# Patient Record
Sex: Female | Born: 1985 | Race: Asian | Hispanic: No | Marital: Married | Smoking: Never smoker
Health system: Southern US, Community
[De-identification: ages and names within clinical notes are randomized; demographics above are authoritative.]

## PROBLEM LIST (undated history)

## (undated) DIAGNOSIS — R55 Syncope and collapse: Secondary | ICD-10-CM

## (undated) DIAGNOSIS — IMO0002 Reserved for concepts with insufficient information to code with codable children: Secondary | ICD-10-CM

## (undated) DIAGNOSIS — R5383 Other fatigue: Secondary | ICD-10-CM

## (undated) DIAGNOSIS — Z789 Other specified health status: Secondary | ICD-10-CM

## (undated) HISTORY — DX: Reserved for concepts with insufficient information to code with codable children: IMO0002

## (undated) HISTORY — DX: Other fatigue: R53.83

## (undated) HISTORY — DX: Syncope and collapse: R55

## (undated) HISTORY — PX: BREAST SURGERY: SHX581

---

## 2008-05-19 ENCOUNTER — Ambulatory Visit (HOSPITAL_COMMUNITY): Admission: RE | Admit: 2008-05-19 | Discharge: 2008-05-19 | Payer: Self-pay | Admitting: Obstetrics & Gynecology

## 2008-08-31 ENCOUNTER — Inpatient Hospital Stay (HOSPITAL_COMMUNITY): Admission: AD | Admit: 2008-08-31 | Discharge: 2008-09-02 | Payer: Self-pay | Admitting: Obstetrics & Gynecology

## 2008-08-31 ENCOUNTER — Ambulatory Visit: Payer: Self-pay | Admitting: Obstetrics and Gynecology

## 2008-09-08 ENCOUNTER — Inpatient Hospital Stay (HOSPITAL_COMMUNITY): Admission: AD | Admit: 2008-09-08 | Discharge: 2008-09-08 | Payer: Self-pay | Admitting: Obstetrics & Gynecology

## 2008-09-08 ENCOUNTER — Ambulatory Visit: Payer: Self-pay | Admitting: Obstetrics and Gynecology

## 2010-08-02 ENCOUNTER — Emergency Department (HOSPITAL_COMMUNITY)
Admission: EM | Admit: 2010-08-02 | Discharge: 2010-08-02 | Payer: Self-pay | Source: Home / Self Care | Admitting: Family Medicine

## 2010-12-06 LAB — CBC
HCT: 38.1 % (ref 36.0–46.0)
HCT: 42.8 % (ref 36.0–46.0)
Hemoglobin: 12.9 g/dL (ref 12.0–15.0)
Hemoglobin: 14.5 g/dL (ref 12.0–15.0)
MCHC: 34 g/dL (ref 30.0–36.0)
MCHC: 33.9 g/dL (ref 30.0–36.0)
MCV: 91.9 fL (ref 78.0–100.0)
MCV: 92.1 fL (ref 78.0–100.0)
Platelets: 195 K/uL (ref 150–400)
RBC: 4.15 MIL/uL (ref 3.87–5.11)
RDW: 13.8 % (ref 11.5–15.5)
RDW: 13.6 % (ref 11.5–15.5)
WBC: 7.3 K/uL (ref 4.0–10.5)

## 2010-12-06 LAB — URINALYSIS, ROUTINE W REFLEX MICROSCOPIC
Glucose, UA: NEGATIVE mg/dL
Hgb urine dipstick: NEGATIVE
Ketones, ur: 15 mg/dL — AB
Nitrite: NEGATIVE
Protein, ur: NEGATIVE mg/dL
Specific Gravity, Urine: 1.025 (ref 1.005–1.030)
Urobilinogen, UA: 0.2 mg/dL (ref 0.0–1.0)
pH: 5.5 (ref 5.0–8.0)

## 2010-12-06 LAB — RPR: RPR Ser Ql: NONREACTIVE

## 2013-08-22 NOTE — L&D Delivery Note (Signed)
Delivery Note At 12:05 AM a viable female was delivered via Vaginal, Spontaneous Delivery (Presentation: ; Occiput Anterior).  APGAR: 9, 9; weight  .   Placenta status: Intact, Spontaneous.  Cord: 3 vessels with the following complications: None.  Cord pH: not obtained  Anesthesia: Local  Episiotomy: None Lacerations: 1st degree;Perineal Suture Repair: 3.0 vicryl rapide Est. Blood Loss (mL):  300 cc  Mom to postpartum.  Baby to Couplet care / Skin to Skin.  Katelyn Bean, Vance Hochmuth C 07/01/2014, 12:44 AM

## 2014-02-17 ENCOUNTER — Other Ambulatory Visit (HOSPITAL_COMMUNITY): Payer: Self-pay | Admitting: Nurse Practitioner

## 2014-02-17 DIAGNOSIS — Z3689 Encounter for other specified antenatal screening: Secondary | ICD-10-CM

## 2014-02-17 LAB — OB RESULTS CONSOLE RUBELLA ANTIBODY, IGM: Rubella: IMMUNE

## 2014-02-17 LAB — OB RESULTS CONSOLE RPR: RPR: NONREACTIVE

## 2014-02-17 LAB — OB RESULTS CONSOLE HIV ANTIBODY (ROUTINE TESTING): HIV: NONREACTIVE

## 2014-02-17 LAB — OB RESULTS CONSOLE ANTIBODY SCREEN: Antibody Screen: NEGATIVE

## 2014-02-17 LAB — OB RESULTS CONSOLE HEPATITIS B SURFACE ANTIGEN: Hepatitis B Surface Ag: NEGATIVE

## 2014-02-17 LAB — OB RESULTS CONSOLE ABO/RH: RH Type: POSITIVE

## 2014-02-17 LAB — OB RESULTS CONSOLE GC/CHLAMYDIA
CHLAMYDIA, DNA PROBE: NEGATIVE
GC PROBE AMP, GENITAL: NEGATIVE

## 2014-02-20 ENCOUNTER — Ambulatory Visit (HOSPITAL_COMMUNITY)
Admission: RE | Admit: 2014-02-20 | Discharge: 2014-02-20 | Disposition: A | Discharge status: Home / Self Care | Payer: BC Managed Care – PPO | Source: Ambulatory Visit | Attending: Nurse Practitioner | Admitting: Nurse Practitioner

## 2014-02-20 DIAGNOSIS — Z3689 Encounter for other specified antenatal screening: Secondary | ICD-10-CM | POA: Diagnosis present

## 2014-04-01 ENCOUNTER — Other Ambulatory Visit: Payer: Self-pay | Admitting: Family Medicine

## 2014-06-16 LAB — OB RESULTS CONSOLE GBS: STREP GROUP B AG: POSITIVE

## 2014-06-30 ENCOUNTER — Inpatient Hospital Stay (HOSPITAL_COMMUNITY)
Admission: AD | Admit: 2014-06-30 | Discharge: 2014-06-30 | Disposition: A | Discharge status: Home / Self Care | Payer: BC Managed Care – PPO | Source: Ambulatory Visit | Attending: Family Medicine | Admitting: Family Medicine

## 2014-06-30 ENCOUNTER — Encounter (HOSPITAL_COMMUNITY): Payer: Self-pay | Admitting: General Practice

## 2014-06-30 ENCOUNTER — Encounter (HOSPITAL_COMMUNITY): Payer: Self-pay

## 2014-06-30 ENCOUNTER — Inpatient Hospital Stay (HOSPITAL_COMMUNITY)
Admission: AD | Admit: 2014-06-30 | Discharge: 2014-07-02 | DRG: 775 | Disposition: A | Discharge status: Home / Self Care | Payer: BC Managed Care – PPO | Source: Ambulatory Visit | Attending: Family Medicine | Admitting: Family Medicine

## 2014-06-30 DIAGNOSIS — Z3A37 37 weeks gestation of pregnancy: Secondary | ICD-10-CM | POA: Diagnosis present

## 2014-06-30 DIAGNOSIS — O99824 Streptococcus B carrier state complicating childbirth: Secondary | ICD-10-CM | POA: Diagnosis present

## 2014-06-30 DIAGNOSIS — IMO0001 Reserved for inherently not codable concepts without codable children: Secondary | ICD-10-CM

## 2014-06-30 DIAGNOSIS — Z3A38 38 weeks gestation of pregnancy: Secondary | ICD-10-CM | POA: Diagnosis not present

## 2014-06-30 DIAGNOSIS — O471 False labor at or after 37 completed weeks of gestation: Secondary | ICD-10-CM | POA: Insufficient documentation

## 2014-06-30 HISTORY — DX: Other specified health status: Z78.9

## 2014-06-30 LAB — URINALYSIS, ROUTINE W REFLEX MICROSCOPIC
Bilirubin Urine: NEGATIVE
Glucose, UA: NEGATIVE mg/dL
KETONES UR: NEGATIVE mg/dL
NITRITE: NEGATIVE
PH: 7 (ref 5.0–8.0)
Protein, ur: NEGATIVE mg/dL
UROBILINOGEN UA: 0.2 mg/dL (ref 0.0–1.0)

## 2014-06-30 LAB — CBC
HEMATOCRIT: 38.3 % (ref 36.0–46.0)
HEMOGLOBIN: 13.2 g/dL (ref 12.0–15.0)
MCH: 31.5 pg (ref 26.0–34.0)
MCHC: 34.5 g/dL (ref 30.0–36.0)
MCV: 91.4 fL (ref 78.0–100.0)
Platelets: 184 10*3/uL (ref 150–400)
RBC: 4.19 MIL/uL (ref 3.87–5.11)
RDW: 14 % (ref 11.5–15.5)
WBC: 7 10*3/uL (ref 4.0–10.5)

## 2014-06-30 LAB — URINE MICROSCOPIC-ADD ON

## 2014-06-30 LAB — COMPREHENSIVE METABOLIC PANEL
ALK PHOS: 160 U/L — AB (ref 39–117)
ALT: 16 U/L (ref 0–35)
ANION GAP: 15 (ref 5–15)
AST: 18 U/L (ref 0–37)
Albumin: 2.9 g/dL — ABNORMAL LOW (ref 3.5–5.2)
BUN: 9 mg/dL (ref 6–23)
CHLORIDE: 99 meq/L (ref 96–112)
CO2: 21 meq/L (ref 19–32)
Calcium: 9.5 mg/dL (ref 8.4–10.5)
Creatinine, Ser: 0.52 mg/dL (ref 0.50–1.10)
Glucose, Bld: 88 mg/dL (ref 70–99)
Potassium: 4.1 mEq/L (ref 3.7–5.3)
SODIUM: 135 meq/L — AB (ref 137–147)
TOTAL PROTEIN: 6.8 g/dL (ref 6.0–8.3)

## 2014-06-30 MED ORDER — OXYCODONE-ACETAMINOPHEN 5-325 MG PO TABS: 2.0000 | ORAL_TABLET | ORAL | Status: DC | PRN

## 2014-06-30 MED ORDER — AMPICILLIN SODIUM 2 G IJ SOLR
2.0000 g | Freq: Once | INTRAMUSCULAR | Status: AC
  Administered 2014-06-30: 2 g via INTRAVENOUS
  Filled 2014-06-30: qty 2000

## 2014-06-30 MED ORDER — INFLUENZA VAC SPLIT QUAD 0.5 ML IM SUSY
0.5000 mL | PREFILLED_SYRINGE | INTRAMUSCULAR | Status: DC
  Filled 2014-06-30: qty 0.5

## 2014-06-30 MED ORDER — ONDANSETRON HCL 4 MG/2ML IJ SOLN: 4.0000 mg | Freq: Four times a day (QID) | INTRAMUSCULAR | Status: DC | PRN

## 2014-06-30 MED ORDER — FENTANYL CITRATE 0.05 MG/ML IJ SOLN: 50.0000 ug | INTRAMUSCULAR | Status: DC | PRN

## 2014-06-30 MED ORDER — OXYTOCIN 40 UNITS IN LACTATED RINGERS INFUSION - SIMPLE MED
62.5000 mL/h | INTRAVENOUS | Status: DC
  Filled 2014-06-30: qty 1000

## 2014-06-30 MED ORDER — OXYTOCIN BOLUS FROM INFUSION
500.0000 mL | INTRAVENOUS | Status: DC
  Administered 2014-07-01: 500 mL via INTRAVENOUS

## 2014-06-30 MED ORDER — OXYCODONE-ACETAMINOPHEN 5-325 MG PO TABS: 1.0000 | ORAL_TABLET | ORAL | Status: DC | PRN

## 2014-06-30 MED ORDER — ACETAMINOPHEN 325 MG PO TABS: 650.0000 mg | ORAL_TABLET | ORAL | Status: DC | PRN

## 2014-06-30 MED ORDER — LIDOCAINE HCL (PF) 1 % IJ SOLN
30.0000 mL | INTRAMUSCULAR | Status: DC | PRN
  Administered 2014-07-01: 30 mL via SUBCUTANEOUS
  Filled 2014-06-30: qty 30

## 2014-06-30 MED ORDER — CITRIC ACID-SODIUM CITRATE 334-500 MG/5ML PO SOLN: 30.0000 mL | ORAL | Status: DC | PRN

## 2014-06-30 MED ORDER — LACTATED RINGERS IV SOLN
500.0000 mL | INTRAVENOUS | Status: DC | PRN
  Administered 2014-06-30: 500 mL via INTRAVENOUS

## 2014-06-30 MED ORDER — LACTATED RINGERS IV SOLN
INTRAVENOUS | Status: DC
  Administered 2014-06-30: 21:00:00 via INTRAVENOUS

## 2014-06-30 MED ORDER — FLEET ENEMA 7-19 GM/118ML RE ENEM: 1.0000 | ENEMA | RECTAL | Status: DC | PRN

## 2014-06-30 MED ORDER — FENTANYL CITRATE 0.05 MG/ML IJ SOLN
100.0000 ug | INTRAMUSCULAR | Status: DC | PRN
  Administered 2014-06-30: 100 ug via INTRAVENOUS
  Filled 2014-06-30: qty 2

## 2014-06-30 NOTE — MAU Note (Signed)
Patient states she has had vaginal bleeding twice this am with bright red blood, more the first time, less the second time. States she is having mild contractions and reports good fetal movement.

## 2014-06-30 NOTE — Discharge Instructions (Signed)
Third Trimester of Pregnancy The third trimester is from week 29 through week 42, months 7 through 9. This trimester is when your unborn baby (fetus) is growing very fast. At the end of the ninth month, the unborn baby is about 20 inches in length. It weighs about 6-10 pounds.  HOME CARE   Avoid all smoking, herbs, and alcohol. Avoid drugs not approved by your doctor.  Only take medicine as told by your doctor. Some medicines are safe and some are not during pregnancy.  Exercise only as told by your doctor. Stop exercising if you start having cramps.  Eat regular, healthy meals.  Wear a good support bra if your breasts are tender.  Helbert not use hot tubs, steam rooms, or saunas.  Wear your seat belt when driving.  Avoid raw meat, uncooked cheese, and liter boxes and soil used by cats.  Take your prenatal vitamins.  Try taking medicine that helps you poop (stool softener) as needed, and if your doctor approves. Eat more fiber by eating fresh fruit, vegetables, and whole grains. Drink enough fluids to keep your pee (urine) clear or pale yellow.  Take warm water baths (sitz baths) to soothe pain or discomfort caused by hemorrhoids. Use hemorrhoid cream if your doctor approves.  If you have puffy, bulging veins (varicose veins), wear support hose. Raise (elevate) your feet for 15 minutes, 3-4 times a day. Limit salt in your diet.  Avoid heavy lifting, wear low heels, and sit up straight.  Rest with your legs raised if you have leg cramps or low back pain.  Visit your dentist if you have not gone during your pregnancy. Use a soft toothbrush to brush your teeth. Be gentle when you floss.  You can have sex (intercourse) unless your doctor tells you not to.  Hargraves not travel far distances unless you must. Only Sather so with your doctor's approval.  Take prenatal classes.  Practice driving to the hospital.  Pack your hospital bag.  Prepare the baby's room.  Go to your doctor visits. GET  HELP IF:  You are not sure if you are in labor or if your water has broken.  You are dizzy.  You have mild cramps or pressure in your lower belly (abdominal).  You have a nagging pain in your belly area.  You continue to feel sick to your stomach (nauseous), throw up (vomit), or have watery poop (diarrhea).  You have bad smelling fluid coming from your vagina.  You have pain with peeing (urination). GET HELP RIGHT AWAY IF:   You have a fever.  You are leaking fluid from your vagina.  You are spotting or bleeding from your vagina.  You have severe belly cramping or pain.  You lose or gain weight rapidly.  You have trouble catching your breath and have chest pain.  You notice sudden or extreme puffiness (swelling) of your face, hands, ankles, feet, or legs.  You have not felt the baby move in over an hour.  You have severe headaches that Sindelar not go away with medicine.  You have vision changes. Document Released: 11/02/2009 Document Revised: 12/03/2012 Document Reviewed: 10/09/2012 ExitCare Patient Information 2015 [employer]. This information is not intended to replace advice given to you by your health care provider. Make sure you discuss any questions you have with your health care provider.  

## 2014-06-30 NOTE — H&P (Signed)
Katelyn Bean is a 28 y.o. female G2P1001 with IUP at 3142w3d presenting for active labor. Pt states she has been having regular, every 5 minutes contractions, associated with scant staining vaginal bleeding.  Membranes are intact, with active fetal movement.   [facility]  Prenatal History/Complications: GBS positive  Past Medical History: Past Medical History  Diagnosis Date  . Medical history non-contributory     Past Surgical History: Past Surgical History  Procedure Laterality Date  . No past surgeries      Obstetrical History: OB History    Gravida Para Term Preterm AB TAB SAB Ectopic Multiple Living   2 1 1       1       Social History: History   Social History  . Marital Status: Married    Spouse Name: N/A    Number of Children: N/A  . Years of Education: N/A   Social History Main Topics  . Smoking status: Never Smoker   . Smokeless tobacco: Never Used  . Alcohol Use: No  . Drug Use: No  . Sexual Activity: Yes   Other Topics Concern  . None   Social History Narrative    Family History: No family history on file.  Allergies: No Known Allergies  Prescriptions prior to admission  Medication Sig Dispense Refill Last Dose  . Prenatal Vit-Fe Fumarate-FA (PRENATAL MULTIVITAMIN) TABS tablet Take 1 tablet by mouth daily at 12 noon.   06/30/2014 at Unknown time  . Prenatal Vit-Fe Fumarate-FA (PREPLUS) 27-1 MG TABS TAKE ONE TABLET BY MOUTH ONCE DAILY (Patient not taking: Reported on 06/30/2014) 50 tablet 0      Review of Systems   Constitutional: No fevers or chills  Temperature 98.2 F (36.8 C), temperature source Oral, height 4\' 11"  (1.499 m), weight 58.514 kg (129 lb). General appearance: alert, cooperative, appears stated age and in pain with contractions Lungs: clear to auscultation bilaterally Heart: regular rate and rhythm Abdomen: soft, non-tender; bowel sounds normal Pelvic: adequate Extremities: Homans sign is  negative, no sign of DVT DTR's wnl Presentation: cephalic Fetal monitoringBaseline: 135 bpm, Variability: Good {> 6 bpm), Accelerations: Reactive and Decelerations: Absent Uterine activityFrequency: Every 2-4 minutes Dilation: 7 Effacement (%): 90 Station: 0 Exam by:: C.Byers, RN   Prenatal labs: ABO, Rh:   Apos Antibody:   Neg Rubella:   Immune RPR:    Neg HBsAg:   Neg HIV:   Declined GBS:   Positive 1 hr Glucola 101 Genetic screening  Neg Anatomy US wnl   Prenatal Transfer Tool  Maternal Diabetes: No Genetic Screening: Normal Maternal Ultrasounds/Referrals: Normal Fetal Ultrasounds or other Referrals:  None Maternal Substance Abuse:  No Significant Maternal Medications:  None Significant Maternal Lab Results: Lab values include: Group B Strep positive     Results for orders placed or performed during the hospital encounter of 06/30/14 (from the past 24 hour(s))  Urinalysis, Routine w reflex microscopic   Collection Time: 06/30/14  9:33 AM  Result Value Ref Range   Color, Urine YELLOW YELLOW   APPearance CLEAR CLEAR   Specific Gravity, Urine <1.005 (L) 1.005 - 1.030   pH 7.0 5.0 - 8.0   Glucose, UA NEGATIVE NEGATIVE mg/dL   Hgb urine dipstick LARGE (A) NEGATIVE   Bilirubin Urine NEGATIVE NEGATIVE   Ketones, ur NEGATIVE NEGATIVE mg/dL   Protein, ur NEGATIVE NEGATIVE mg/dL   Urobilinogen, UA 0.2 0.0 - 1.0 mg/dL   Nitrite NEGATIVE NEGATIVE   Leukocytes, UA  SMALL (A) NEGATIVE  Urine microscopic-add on   Collection Time: 06/30/14  9:33 AM  Result Value Ref Range   Squamous Epithelial / LPF FEW (A) RARE   WBC, UA 3-6 <3 WBC/hpf   RBC / HPF 3-6 <3 RBC/hpf   Bacteria, UA FEW (A) RARE    Assessment: Katelyn Bean is a 28 y.o. G2P1001 at 3649w3d here with active labor #Labor:SOL, expectant mangement #Pain: Analgesia and anesthesia prn #FWB: Category 1 #ID:  GBS positive, will start ampicillin since advanced labor #MOF: breast #MOC:Nexplanon #Circ:   Planning  Jacquiline Doearker, Konnor Vondrasek 06/30/2014, 8:56 PM

## 2014-06-30 NOTE — Progress Notes (Signed)
Dr. Loreta AveAcosta notified of pt in MAU for labor eval, blood noted when wiping, cervix 1.5/thick, bloody show noted, efm tracing reactive, ctx's irregular, bp's, order to d/c home.

## 2014-06-30 NOTE — MAU Note (Signed)
Pt states her contractions started to get bad around 5pm. She states they are coming around 5 mins apart. She rates the pain 5/10. She does not report any leaking of fluid but does have a little bit of blood from an earlier vaginal exam.

## 2014-06-30 NOTE — MAU Note (Signed)
Pt states had streak of blood noted when wiping this am while in restroom. [facility]. Denies h/a, dizziness, or blurred vision.

## 2014-06-30 NOTE — Progress Notes (Signed)
Patient ID: Katelyn Bean, female   DOB: 05/04/1986, 28 y.o.   MRN: [id number] Labor Progress Note  ASSESSMENT:   Katelyn Bean 28 y.o. G2P1001 at 1156w3d in active labor   PLAN:  1) Labor curve reviewed.       Progress: Active, appropriate             Plan: Expectant; consider AROM  2) Fetal heart tracing reviewed.    Cat I, reassuring  3) GBS Status - POS, s/p ampicillin  4) Elevcated BP - Several BP of 140s/90s, will send pre-eclampsia labs - not yet meeting criteria of gHTN given BP < 4 hours apart  5) Other Problems Active Problems:   Active labor   SUBJECTIVE:  Feels increasing pain with contractions   OBJECTIVE:  Vital Signs: Patient Vitals for the past 2 hrs:  BP Pulse  06/30/14 2245 (!) 145/95 mmHg 88   SVE: Dilation: 9, Effacement (%): 90, 100, Station: 0  FHR Monitoring Baseline Rate (A): 140 bpm / moderate variability / + accels / neg decels   Accelerations: 15 x 15 Contraction Frequency (min): 2-5

## 2014-07-01 ENCOUNTER — Encounter (HOSPITAL_COMMUNITY): Payer: Self-pay | Admitting: *Deleted

## 2014-07-01 DIAGNOSIS — Z3A37 37 weeks gestation of pregnancy: Secondary | ICD-10-CM

## 2014-07-01 DIAGNOSIS — O99824 Streptococcus B carrier state complicating childbirth: Secondary | ICD-10-CM

## 2014-07-01 LAB — CBC
HEMATOCRIT: 34.3 % — AB (ref 36.0–46.0)
Hemoglobin: 11.8 g/dL — ABNORMAL LOW (ref 12.0–15.0)
MCH: 31.1 pg (ref 26.0–34.0)
MCHC: 34.4 g/dL (ref 30.0–36.0)
MCV: 90.5 fL (ref 78.0–100.0)
PLATELETS: 171 10*3/uL (ref 150–400)
RBC: 3.79 MIL/uL — ABNORMAL LOW (ref 3.87–5.11)
RDW: 13.9 % (ref 11.5–15.5)
WBC: 10.9 10*3/uL — AB (ref 4.0–10.5)

## 2014-07-01 LAB — TYPE AND SCREEN
ABO/RH(D): A POS
ANTIBODY SCREEN: NEGATIVE

## 2014-07-01 LAB — PROTEIN / CREATININE RATIO, URINE
Creatinine, Urine: 51.71 mg/dL
PROTEIN CREATININE RATIO: 0.47 — AB (ref 0.00–0.15)
Total Protein, Urine: 24.2 mg/dL

## 2014-07-01 LAB — HIV ANTIBODY (ROUTINE TESTING W REFLEX): HIV: NONREACTIVE

## 2014-07-01 LAB — RPR

## 2014-07-01 LAB — ABO/RH: ABO/RH(D): A POS

## 2014-07-01 MED ORDER — WITCH HAZEL-GLYCERIN EX PADS
1.0000 | MEDICATED_PAD | CUTANEOUS | Status: DC | PRN
Start: 2014-07-01 — End: 2014-07-02

## 2014-07-01 MED ORDER — ONDANSETRON HCL 4 MG PO TABS: 4.0000 mg | ORAL_TABLET | ORAL | Status: DC | PRN

## 2014-07-01 MED ORDER — PRENATAL MULTIVITAMIN CH
1.0000 | ORAL_TABLET | Freq: Every day | ORAL | Status: DC
  Administered 2014-07-01 – 2014-07-02 (×2): 1 via ORAL
  Filled 2014-07-01 (×2): qty 1

## 2014-07-01 MED ORDER — LANOLIN HYDROUS EX OINT: TOPICAL_OINTMENT | CUTANEOUS | Status: DC | PRN

## 2014-07-01 MED ORDER — BENZOCAINE-MENTHOL 20-0.5 % EX AERO
1.0000 | INHALATION_SPRAY | CUTANEOUS | Status: DC | PRN
Start: 2014-07-01 — End: 2014-07-02

## 2014-07-01 MED ORDER — OXYCODONE-ACETAMINOPHEN 5-325 MG PO TABS: 1.0000 | ORAL_TABLET | ORAL | Status: DC | PRN

## 2014-07-01 MED ORDER — SENNOSIDES-DOCUSATE SODIUM 8.6-50 MG PO TABS
2.0000 | ORAL_TABLET | ORAL | Status: DC
  Administered 2014-07-02: 2 via ORAL
  Filled 2014-07-01 (×2): qty 2

## 2014-07-01 MED ORDER — DIPHENHYDRAMINE HCL 25 MG PO CAPS: 25.0000 mg | ORAL_CAPSULE | Freq: Four times a day (QID) | ORAL | Status: DC | PRN

## 2014-07-01 MED ORDER — OXYCODONE-ACETAMINOPHEN 5-325 MG PO TABS
2.0000 | ORAL_TABLET | ORAL | Status: DC | PRN
Start: 2014-07-01 — End: 2014-07-02

## 2014-07-01 MED ORDER — DIBUCAINE 1 % RE OINT: 1.0000 "application " | TOPICAL_OINTMENT | RECTAL | Status: DC | PRN

## 2014-07-01 MED ORDER — ZOLPIDEM TARTRATE 5 MG PO TABS: 5.0000 mg | ORAL_TABLET | Freq: Every evening | ORAL | Status: DC | PRN

## 2014-07-01 MED ORDER — IBUPROFEN 600 MG PO TABS
600.0000 mg | ORAL_TABLET | Freq: Four times a day (QID) | ORAL | Status: DC
  Administered 2014-07-01 – 2014-07-02 (×5): 600 mg via ORAL
  Filled 2014-07-01 (×8): qty 1

## 2014-07-01 MED ORDER — ONDANSETRON HCL 4 MG/2ML IJ SOLN: 4.0000 mg | INTRAMUSCULAR | Status: DC | PRN

## 2014-07-01 MED ORDER — SIMETHICONE 80 MG PO CHEW: 80.0000 mg | CHEWABLE_TABLET | ORAL | Status: DC | PRN

## 2014-07-01 MED ORDER — TETANUS-DIPHTH-ACELL PERTUSSIS 5-2.5-18.5 LF-MCG/0.5 IM SUSP: 0.5000 mL | Freq: Once | INTRAMUSCULAR | Status: DC

## 2014-07-01 MED ORDER — INFLUENZA VAC SPLIT QUAD 0.5 ML IM SUSY
0.5000 mL | PREFILLED_SYRINGE | INTRAMUSCULAR | Status: AC
  Administered 2014-07-02: 0.5 mL via INTRAMUSCULAR

## 2014-07-01 NOTE — Lactation Note (Signed)
This note was copied from the chart of [name]. Lactation Consultation Note; Mom reports that baby nursed for 10 min about 1 1/2 hours ago. Now has been trying but baby is too sleepy. Mom is easily able to hand express Colostrum. Reports her first baby never latched and got bottles then wouldn't latch. Tried for about 1 month. Mom reports this baby is nursing much better than her first Reviewed feeding cues and encouraged to feed whenever she sees them.BF brochure given with resources for support after DC. No questions at present. Encouraged to page when baby wakes for next feeding.  Patient Name: [name] VWUJW'JToday's Date: 07/01/2014 Reason for consult: Initial assessment;Infant < 6lbs;Late preterm infant   Maternal Data Formula Feeding for Exclusion: No Does the patient have breastfeeding experience prior to this delivery?: Yes  Feeding Feeding Type: Breast Fed Length of feed: 10 min  LATCH Score/Interventions Latch: Too sleepy or reluctant, no latch achieved, no sucking elicited.  Audible Swallowing: None  Type of Nipple: Everted at rest and after stimulation  Comfort (Breast/Nipple): Soft / non-tender     Hold (Positioning): No assistance needed to correctly position infant at breast.  LATCH Score: 6  Lactation Tools Discussed/Used     Consult Status Consult Status: Follow-up Date: 07/02/14 Follow-up type: In-patient    Pamelia HoitWeeks, Francisca Langenderfer D 07/01/2014, 11:13 AM

## 2014-07-01 NOTE — Plan of Care (Signed)
Problem: Phase II Progression Outcomes Goal: Progress activity as tolerated unless otherwise ordered Outcome: Completed/Met Date Met:  07/01/14 Goal: Afebrile, VS remain stable Outcome: Completed/Met Date Met:  07/01/14 Goal: Rh isoimmunization per orders Outcome: Not Applicable Date Met:  05/29/11 Goal: Tolerating diet Outcome: Completed/Met Date Met:  07/01/14 Goal: Other Phase II Outcomes/Goals Outcome: Completed/Met Date Met:  07/01/14

## 2014-07-01 NOTE — Plan of Care (Signed)
Problem: Consults Goal: Postpartum Patient Education (See Patient Education module for education specifics.) Outcome: Completed/Met Date Met:  07/01/14  Problem: Phase I Progression Outcomes Goal: Pain controlled with appropriate interventions Outcome: Completed/Met Date Met:  07/01/14 Goal: Voiding adequately Outcome: Completed/Met Date Met:  07/01/14 Goal: OOB as tolerated unless otherwise ordered Outcome: Completed/Met Date Met:  07/01/14 Goal: VS, stable, temp < 100.4 degrees F Outcome: Completed/Met Date Met:  07/01/14 Goal: Initial discharge plan identified Outcome: Completed/Met Date Met:  07/01/14 Goal: Other Phase I Outcomes/Goals Outcome: Completed/Met Date Met:  07/01/14  Problem: Phase II Progression Outcomes Goal: Pain controlled on oral analgesia Outcome: Completed/Met Date Met:  07/01/14

## 2014-07-02 MED ORDER — IBUPROFEN 600 MG PO TABS: 600.0000 mg | ORAL_TABLET | Freq: Four times a day (QID) | ORAL | Status: DC

## 2014-07-02 NOTE — Discharge Summary (Signed)
Obstetric Discharge Summary Reason for Admission: onset of labor Prenatal Procedures: none Intrapartum Procedures: spontaneous vaginal delivery Postpartum Procedures: none Complications-Operative and Postpartum: first degree perineal laceration  Hospital Course:  Patient was admitted in active labor and progressed quickly without complication.   Delivery Note At 12:05 AM a viable female was delivered via Vaginal, Spontaneous Delivery (Presentation: ; Occiput Anterior). APGAR: 9, 9; weight .  Placenta status: Intact, Spontaneous. Cord: 3 vessels with the following complications: None. Cord pH: not obtained  Anesthesia: Local  Episiotomy: None Lacerations: 1st degree;Perineal Suture Repair: 3.0 vicryl rapide Est. Blood Loss (mL): 300 cc  Mom to postpartum. Baby to Couplet care / Skin to Skin.  Elita BooneRoberts, Caroline C 07/01/2014, 12:44 AM  Postpartum, the patient did well with no complications.  On the day of discharge,  Pt denied problems with ambulating, voiding or po intake.  She denied nausea or vomiting.  Pain was well controlled.  She has had flatus. She has had bowel movement.  Lochia Small.  Plan for birth control is nexplanon.    H/H: Lab Results  Component Value Date/Time   HGB 11.8* 07/01/2014 06:07 AM   HCT 34.3* 07/01/2014 06:07 AM    Filed Vitals:   07/02/14 0552  BP: 106/66  Pulse: 73  Temp: 97.9 F (36.6 C)  Resp:     Physical Exam: VSS NAD Abd: Appropriately tender, ND, Fundus firm, below umbilicus No c/c/e, Neg homan's sign, neg cords Lochia Appropriate  Discharge Diagnoses: Term Pregnancy-delivered  Discharge Information: Date: 07/02/2014 Activity: pelvic rest Diet: routine  Medications: PNV and Ibuprofen Breast feeding:  Yes Condition: stable Instructions: refer to handout Discharge to: home      Medication List    TAKE these medications        ibuprofen 600 MG tablet  Commonly known as:  ADVIL,MOTRIN  Take 1 tablet (600 mg  total) by mouth every 6 (six) hours.     prenatal multivitamin Tabs tablet  Take 1 tablet by mouth daily at 12 noon.       Follow-up Information    [facility] [place] Schedule an appointment as soon as possible for a visit in 6 weeks.   Why:  for postpartum follow up   Contact information:   [address] [facility] [place] [id number] [phone]      Jacquiline DoeParker, Ashanti Ratti 07/02/2014,7:32 AM

## 2014-07-02 NOTE — Lactation Note (Signed)
This note was copied from the chart of Katelyn Betsy PriesYen Hoaglin. Lactation Consultation Note: Infant is 2133 hours old and is at 5 % weight loss. Weight 5.1.  Infant 37.4 weeks. Mother states that infant falls asleep while breastfeeding. Advised mother on behaviors of Near term infants.  Advised mother of need to rouse infant with STS. Assist mother with hand expression. Infant fed 5 ml of EBM with a spoon. Mother has large amts of colostrum. Infant latced on (R) breast with good depth. Observed audible swallows. Mother taught breast compression. Infant sustained latch for 15 mins. Assist mother with post pumping. Mother pumped 5 mls of ebm. Mother was advised to breastfeed, supplement with EBM and post pump. Mother has good understanding of breastfeeding plan. Mother was given a manual pump with instructions. [facility] for follow up. [facility]. Recommend that mother continue to feed infant at least 8-12 times in 24 hours of life.  Patient Name: Katelyn Bean WUJWJ'XToday's Date: 07/02/2014 Reason for consult: Follow-up assessment   Maternal Data    Feeding Feeding Type: Breast Milk Length of feed: 15 min  LATCH Score/Interventions Latch: Grasps breast easily, tongue down, lips flanged, rhythmical sucking. Intervention(s): Skin to skin;Waking techniques Intervention(s): Adjust position;Assist with latch;Breast compression  Audible Swallowing: Spontaneous and intermittent Intervention(s): Hand expression Intervention(s): Skin to skin;Hand expression  Type of Nipple: Everted at rest and after stimulation  Comfort (Breast/Nipple): Filling, red/small blisters or bruises, mild/mod discomfort  Problem noted: Filling  Hold (Positioning): Assistance needed to correctly position infant at breast and maintain latch. Intervention(s): Support Pillows;Position options;Skin to skin  LATCH Score: 8  Lactation Tools Discussed/Used     Consult  Status Consult Status: Follow-up Date: 07/03/14 Follow-up type: In-patient    Stevan BornKendrick, Pamela [facility] 07/02/2014, 11:08 AM

## 2014-07-02 NOTE — Discharge Instructions (Signed)

## 2014-07-03 ENCOUNTER — Ambulatory Visit: Payer: Self-pay

## 2014-07-03 NOTE — Lactation Note (Addendum)
This note was copied from the chart of Katelyn Betsy PriesYen Reaney. Lactation Consultation Note; Follow up visit with mom before DC. Mom reports he is nursing better and her breasts are feeling heavier today. Reports that breasts soften after nursing. Reports nipples are a little sore. It hurts the first few minutes of nursing but then eases off. Baby asleep in visitors arms. Comfort gels given with instructions for use. LS 9 by RN. Weight the same as yesterday.No questions at present. To call prn. Reviewed BFS[facility] and OP appointments as resources for support after DC.   Patient Name: Katelyn Bean HYQMV'HToday's Date: 07/03/2014 Reason for consult: Follow-up assessment;Infant < 6lbs   Maternal Data Formula Feeding for Exclusion: No Has patient been taught Hand Expression?: Yes Does the patient have breastfeeding experience prior to this delivery?: Yes  Feeding   LATCH Score/Interventions  Lactation Tools Discussed/Used     Consult Status Consult Status: Complete    Pamelia HoitWeeks, Jafar Poffenberger D 07/03/2014, 10:30 AM

## 2014-07-10 ENCOUNTER — Ambulatory Visit: Payer: BC Managed Care – PPO | Admitting: Family Medicine

## 2014-07-14 ENCOUNTER — Other Ambulatory Visit: Payer: Self-pay | Admitting: Family Medicine

## 2014-08-11 ENCOUNTER — Ambulatory Visit: Payer: BC Managed Care – PPO | Admitting: Family Medicine

## 2014-08-11 ENCOUNTER — Encounter: Payer: Self-pay | Admitting: Family Medicine

## 2014-09-02 ENCOUNTER — Encounter: Payer: Self-pay | Admitting: General Practice

## 2015-05-07 ENCOUNTER — Other Ambulatory Visit: Payer: Self-pay | Admitting: "Women's Health Care

## 2015-05-07 DIAGNOSIS — N6320 Unspecified lump in the left breast, unspecified quadrant: Secondary | ICD-10-CM

## 2015-05-12 ENCOUNTER — Ambulatory Visit
Admission: RE | Admit: 2015-05-12 | Discharge: 2015-05-12 | Disposition: A | Discharge status: Home / Self Care | Payer: BLUE CROSS/BLUE SHIELD | Source: Ambulatory Visit | Attending: "Women's Health Care | Admitting: "Women's Health Care

## 2015-05-12 DIAGNOSIS — N6320 Unspecified lump in the left breast, unspecified quadrant: Secondary | ICD-10-CM

## 2015-08-03 ENCOUNTER — Encounter: Payer: Self-pay | Admitting: *Deleted

## 2015-08-04 ENCOUNTER — Encounter: Payer: Self-pay | Admitting: Internal Medicine

## 2015-08-06 ENCOUNTER — Ambulatory Visit (INDEPENDENT_AMBULATORY_CARE_PROVIDER_SITE_OTHER): Payer: BLUE CROSS/BLUE SHIELD | Admitting: Cardiology

## 2015-08-06 ENCOUNTER — Encounter: Payer: Self-pay | Admitting: Cardiology

## 2015-08-06 VITALS — BP 129/87 | HR 81 | Ht 59.0 in | Wt 103.6 lb

## 2015-08-06 DIAGNOSIS — R55 Syncope and collapse: Secondary | ICD-10-CM | POA: Diagnosis not present

## 2015-08-06 NOTE — Progress Notes (Signed)
Electrophysiology Office Note   Date:  08/06/2015   ID:  Katelyn SilvasYen Thi Gotham, DOB Jul 25, 1986, MRN [id number]  PCP:  Frederich ChickWEBB, CAROL D, MD  Primary Electrophysiologist: Regan LemmingWill Martin Cacie Gaskins, MD    Chief Complaint  Patient presents with  . Advice Only    syncope  . Loss of Consciousness     History of Present Illness: Katelyn Bean is a 29 y.o. female who presents today for electrophysiology evaluation.   She presents today for evaluation of syncope. She says that she was working out at her gym in her stomach being hurt and she passed out. She stated that she has passed out twice since then. She has been having these symptoms for the last 3 years. She has passed out 3 times a minute and that amount of time. She passed out once last week while at the gym and then last September. They are all similar symptoms with abdominal pain and headache that preceded the syncope.  Today, she denies symptoms of palpitations, chest pain, shortness of breath, orthopnea, PND, lower extremity edema, claudication, dizziness, presyncope, syncope, bleeding, or neurologic sequela. The patient is tolerating medications without difficulties and is otherwise without complaint today.    Past Medical History  Diagnosis Date  . Medical history non-contributory   . Syncope     x2  . Fatigue   . Gave birth to child recently     x2   Past Surgical History  Procedure Laterality Date  . No past surgeries       Current Outpatient Prescriptions  Medication Sig Dispense Refill  . ciprofloxacin (CIPRO) 500 MG tablet Take 500 mg by mouth 2 (two) times daily.  0   No current facility-administered medications for this visit.    Allergies:   Review of patient's allergies indicates no known allergies.   Social History:  The patient  reports that she has never smoked. She has never used smokeless tobacco. She reports that she does not drink alcohol or use illicit drugs.   Family History:  The patient's family history includes  Arthritis in her father; Hypertension in her mother.    ROS:  Please see the history of present illness.   Otherwise, review of systems is positive for weight change, chest pain, abdominal pain, dizziness, passing out, headaches, back pain.   All other systems are reviewed and negative.    PHYSICAL EXAM: VS:  BP 129/87 mmHg  Pulse 81  Ht 4\' 11"  (1.499 m)  Wt 103 lb 9.6 oz (46.993 kg)  BMI 20.91 kg/m2 , BMI Body mass index is 20.91 kg/(m^2). GEN: Well nourished, well developed, in no acute distress HEENT: normal Neck: no JVD, carotid bruits, or masses Cardiac: RRR; no murmurs, rubs, or gallops,no edema  Respiratory:  clear to auscultation bilaterally, normal work of breathing GI: soft, nontender, nondistended, + BS MS: no deformity or atrophy Skin: warm and dry Neuro:  Strength and sensation are intact Psych: euthymic mood, full affect  EKG:  EKG is ordered today. The ekg ordered today shows sinus rhythm, rate 78  Recent Labs: No results found for requested labs within last 365 days.    Lipid Panel  No results found for: CHOL, TRIG, HDL, CHOLHDL, VLDL, LDLCALC, LDLDIRECT   Wt Readings from Last 3 Encounters:  08/06/15 103 lb 9.6 oz (46.993 kg)  06/30/14 129 lb (58.514 kg)  06/30/14 129 lb 6.4 oz (58.695 kg)    ASSESSMENT AND PLAN:  1.  Syncope: unknown cause as she  has a normal ECG.  She did have syncope post exercise once and Kirsi Hugh therefore order treadmill exercise test.  If that is negative, Caprisha Bridgett also get a  30 day event monitor to determine if she has any cardiac causes of syncope.  Due to her symptoms it is possible that these are all vagally induced as she does have abdominal pain and headache prior to the episodes of syncope. Due to the fact that she is passing out, she is not to drive for 6 months by [place] law unless a cause of her syncope has been discovered.  If it is determined that the episodes of syncope are caused by vagal episodes, she would be able to drive  depending on further evaluation and education of symptoms. She can follow up with [place] PRN depending on the results of the monitor and stress test.   Current medicines are reviewed at length with the patient today.   The patient does not have concerns regarding her medicines.  The following changes were made today:  none  Labs/ tests ordered today include:  Orders Placed This Encounter  Procedures  . Exercise Tolerance Test  . Cardiac event monitor  . EKG 12-Lead     Disposition:   FU with Infantof Villagomez pending monitor results  Signed, Jerricka Carvey Jorja Loa, MD  08/06/2015 3:06 PM     [facility] [address] [place] [address] [phone] (office) [phone] (fax)

## 2015-08-06 NOTE — Patient Instructions (Signed)
Medication Instructions:  Your physician recommends that you continue on your current medications as directed. Please refer to the Current Medication list given to you today.  Labwork: None ordered  Testing/Procedures: Your physician has requested that you have an exercise tolerance test. For further information please visit [email or web address] Please also follow instruction sheet, as given.  Your physician has recommended that you wear an event monitor. Event monitors are medical devices that record the heart's electrical activity. Doctors most often [place] these monitors to diagnose arrhythmias. Arrhythmias are problems with the speed or rhythm of the heartbeat. The monitor is a small, portable device. You can wear one while you Sherrer your normal daily activities. This is usually used to diagnose what is causing palpitations/syncope (passing out).  Follow-Up: To be determined after testing.  If you need a refill on your cardiac medications before your next appointment, please call your pharmacy.  Thank you for choosing [facility]!!   Dory Horn, RN [phone]    Exercise Stress Electrocardiogram An exercise stress electrocardiogram is a test that is done to evaluate the blood supply to your heart. This test may also be called exercise stress electrocardiography. The test is done while you are walking on a treadmill. The goal of this test is to raise your heart rate. This test is done to find areas of poor blood flow to the heart by determining the extent of coronary artery disease (CAD).   CAD is defined as narrowing in one or more heart (coronary) arteries of more than 70%. If you have an abnormal test result, this may mean that you are not getting adequate blood flow to your heart during exercise. Additional testing may be needed to understand why your test was abnormal. LET [facility] CARE PROVIDER KNOW ABOUT:   Any allergies you have.  All medicines you are taking, including  vitamins, herbs, eye drops, creams, and over-the-counter medicines.  Previous problems you or members of your family have had with the use of anesthetics.  Any blood disorders you have.  Previous surgeries you have had.  Medical conditions you have.  Possibility of pregnancy, if this applies. RISKS AND COMPLICATIONS Generally, this is a safe procedure. However, as with any procedure, complications can occur. Possible complications can include:  Pain or pressure in the following areas:  Chest.  Jaw or neck.  Between your shoulder blades.  Radiating down your left arm.  Dizziness or light-headedness.  Shortness of breath.  Increased or irregular heartbeats.  Nausea or vomiting.  Heart attack (rare). BEFORE THE PROCEDURE  Avoid all forms of caffeine 24 hours before your test or as directed by your health care provider. This includes coffee, tea (even decaffeinated tea), caffeinated sodas, chocolate, cocoa, and certain pain medicines.  Follow your health care provider's instructions regarding eating and drinking before the test.  Take your medicines as directed at regular times with water unless instructed otherwise. Exceptions may include:  If you have diabetes, ask how you are to take your insulin or pills. It is common to adjust insulin dosing the morning of the test.  If you are taking beta-blocker medicines, it is important to talk to your health care provider about these medicines well before the date of your test. Taking beta-blocker medicines may interfere with the test. In some cases, these medicines need to be changed or stopped 24 hours or more before the test.  If you wear a nitroglycerin patch, it may need to be removed prior to the test. Ask  your health care provider if the patch should be removed before the test.  If you use an inhaler for any breathing condition, bring it with you to the test.  If you are an outpatient, bring a snack so you can eat right  after the stress phase of the test.  Kervin not smoke for 4 hours prior to the test or as directed by your health care provider.  Schirmer not apply lotions, powders, creams, or oils on your chest prior to the test.  Wear loose-fitting clothes and comfortable shoes for the test. This test involves walking on a treadmill. PROCEDURE  Multiple patches (electrodes) will be put on your chest. If needed, small areas of your chest may have to be shaved to get better contact with the electrodes. Once the electrodes are attached to your body, multiple wires will be attached to the electrodes and your heart rate will be monitored.  Your heart will be monitored both at rest and while exercising.  You will walk on a treadmill. The treadmill will be started at a slow pace. The treadmill speed and incline will gradually be increased to raise your heart rate. AFTER THE PROCEDURE  Your heart rate and blood pressure will be monitored after the test.  You may return to your normal schedule including diet, activities, and medicines, unless your health care provider tells you otherwise.   This information is not intended to replace advice given to you by your health care provider. Make sure you discuss any questions you have with your health care provider.   Document Released: 08/05/2000 Document Revised: 08/13/2013 Document Reviewed: 04/15/2013 Elsevier Interactive Patient Education [employer]2016 Elsevier Inc.

## 2015-08-11 ENCOUNTER — Ambulatory Visit (HOSPITAL_COMMUNITY)
Admission: RE | Admit: 2015-08-11 | Discharge: 2015-08-11 | Disposition: A | Discharge status: Home / Self Care | Payer: BLUE CROSS/BLUE SHIELD | Source: Ambulatory Visit | Attending: Cardiology | Admitting: Cardiology

## 2015-08-11 ENCOUNTER — Ambulatory Visit (INDEPENDENT_AMBULATORY_CARE_PROVIDER_SITE_OTHER): Payer: BLUE CROSS/BLUE SHIELD

## 2015-08-11 ENCOUNTER — Encounter (HOSPITAL_COMMUNITY): Payer: BLUE CROSS/BLUE SHIELD

## 2015-08-11 DIAGNOSIS — R55 Syncope and collapse: Secondary | ICD-10-CM | POA: Diagnosis present

## 2015-08-24 LAB — EXERCISE TOLERANCE TEST
CHL CUP MPHR: 192 {beats}/min
CSEPEDS: 42 s
CSEPEW: 11.2 METS
CSEPHR: 86 %
CSEPPHR: 166 {beats}/min
Exercise duration (min): 9 min
Rest HR: 79 {beats}/min

## 2015-09-30 ENCOUNTER — Other Ambulatory Visit: Payer: Self-pay | Admitting: Obstetrics and Gynecology

## 2015-09-30 DIAGNOSIS — N63 Unspecified lump in unspecified breast: Secondary | ICD-10-CM

## 2015-10-02 ENCOUNTER — Other Ambulatory Visit: Payer: BLUE CROSS/BLUE SHIELD

## 2015-10-07 ENCOUNTER — Ambulatory Visit
Admission: RE | Admit: 2015-10-07 | Discharge: 2015-10-07 | Disposition: A | Discharge status: Home / Self Care | Payer: BLUE CROSS/BLUE SHIELD | Source: Ambulatory Visit | Attending: Obstetrics and Gynecology | Admitting: Obstetrics and Gynecology

## 2015-10-07 DIAGNOSIS — N63 Unspecified lump in unspecified breast: Secondary | ICD-10-CM

## 2016-01-03 ENCOUNTER — Emergency Department (HOSPITAL_COMMUNITY)
Admission: EM | Admit: 2016-01-03 | Discharge: 2016-01-03 | Disposition: A | Discharge status: Home / Self Care | Payer: BLUE CROSS/BLUE SHIELD | Attending: Emergency Medicine | Admitting: Emergency Medicine

## 2016-01-03 ENCOUNTER — Emergency Department (HOSPITAL_COMMUNITY): Payer: BLUE CROSS/BLUE SHIELD

## 2016-01-03 ENCOUNTER — Encounter (HOSPITAL_COMMUNITY): Payer: Self-pay | Admitting: *Deleted

## 2016-01-03 DIAGNOSIS — R1030 Lower abdominal pain, unspecified: Secondary | ICD-10-CM | POA: Diagnosis not present

## 2016-01-03 DIAGNOSIS — Z3A00 Weeks of gestation of pregnancy not specified: Secondary | ICD-10-CM | POA: Insufficient documentation

## 2016-01-03 DIAGNOSIS — Z792 Long term (current) use of antibiotics: Secondary | ICD-10-CM | POA: Diagnosis not present

## 2016-01-03 DIAGNOSIS — O9989 Other specified diseases and conditions complicating pregnancy, childbirth and the puerperium: Secondary | ICD-10-CM | POA: Insufficient documentation

## 2016-01-03 DIAGNOSIS — Z3490 Encounter for supervision of normal pregnancy, unspecified, unspecified trimester: Secondary | ICD-10-CM

## 2016-01-03 DIAGNOSIS — O4690 Antepartum hemorrhage, unspecified, unspecified trimester: Secondary | ICD-10-CM

## 2016-01-03 DIAGNOSIS — O209 Hemorrhage in early pregnancy, unspecified: Secondary | ICD-10-CM | POA: Diagnosis present

## 2016-01-03 LAB — I-STAT BETA HCG BLOOD, ED (MC, WL, AP ONLY)

## 2016-01-03 LAB — HCG, QUANTITATIVE, PREGNANCY: hCG, Beta Chain, Quant, S: 3338 m[IU]/mL — ABNORMAL HIGH (ref <5)

## 2016-01-03 NOTE — ED Provider Notes (Signed)
CSN: [id number]     Arrival date & time 01/03/16  1425 History   First MD Initiated Contact with Patient 01/03/16 1723     Chief Complaint  Patient presents with  . Vaginal Bleeding    Preganant 2.5 months     (Consider location/radiation/quality/duration/timing/severity/associated sxs/prior Treatment) HPI Patient presents to the emergency department with vaginal bleeding for the last 3 days.  The patient states that she is currently pregnant and states that she has gotten an appointment with her GYN doctor on Tuesday.  Patient states she did not see any tissue passed and the blood.  She states there was some small clots, but no large clots.  She also states there is no mucus noted.  Patient denies any abdominal pain. The patient denies chest pain, shortness of breath, headache,blurred vision, neck pain, fever, cough, weakness, numbness, dizziness, anorexia, edema, abdominal pain, nausea, vomiting, diarrhea, rash, back pain, dysuria, hematemesis, bloody stool, near syncope, or syncope. Past Medical History  Diagnosis Date  . Medical history non-contributory   . Syncope     x2  . Fatigue   . Gave birth to child recently     x2   Past Surgical History  Procedure Laterality Date  . No past surgeries     Family History  Problem Relation Age of Onset  . Hypertension Mother   . Arthritis Father    Social History  Substance Use Topics  . Smoking status: Never Smoker   . Smokeless tobacco: Never Used  . Alcohol Use: No   OB History    Gravida Para Term Preterm AB TAB SAB Ectopic Multiple Living   2 2 2       0 1     Review of Systems   All other systems negative except as documented in the HPI. All pertinent positives and negatives as reviewed in the HPI. Allergies  Review of patient's allergies indicates no known allergies.  Home Medications   Prior to Admission medications   Medication Sig Start Date End Date Taking? Authorizing Provider  ciprofloxacin (CIPRO) 500 MG  tablet Take 500 mg by mouth 2 (two) times daily. 07/31/15   Historical Provider, MD   BP 110/85 mmHg  Pulse 79  Temp(Src) 97.6 F (36.4 C) (Oral)  Resp 18  SpO2 99%  LMP 10/22/2015 Physical Exam  Constitutional: She is oriented to person, place, and time. She appears well-developed and well-nourished. No distress.  HENT:  Head: Normocephalic and atraumatic.  Mouth/Throat: Oropharynx is clear and moist.  Eyes: Pupils are equal, round, and reactive to light.  Neck: Normal range of motion. Neck supple.  Cardiovascular: Normal rate, regular rhythm and normal heart sounds.  Exam reveals no gallop and no friction rub.   No murmur heard. Pulmonary/Chest: Effort normal and breath sounds normal. No respiratory distress. She has no wheezes.  Abdominal: Soft. Bowel sounds are normal. She exhibits no distension. There is tenderness.    Neurological: She is alert and oriented to person, place, and time. She exhibits normal muscle tone. Coordination normal.  Skin: Skin is warm and dry. No rash noted. No erythema.  Psychiatric: She has a normal mood and affect. Her behavior is normal.  Nursing note and vitals reviewed.   ED Course  Procedures (including critical care time) Labs Review Labs Reviewed  HCG, QUANTITATIVE, PREGNANCY - Abnormal; Notable for the following:    hCG, Beta Chain, Quant, S 3338 (*)    All other components within normal limits  I-STAT BETA HCG BLOOD,  ED ([facility], [facility], [facility] ONLY) - Abnormal; Notable for the following:    I-stat hCG, quantitative >2000.0 (*)    All other components within normal limits    Imaging Review No results found. I have personally reviewed and evaluated these images and lab results as part of my medical decision-making.   Patient will have an ultrasound to assess her pregnancy.  Patient will be advised she will need to keep her appointment for Tuesday with her GYN doctor..I did explain to the patient that this could still represent an early  miscarriage, but there was a possibility.  2 gestational sacs.  The patient voiced an understanding the plan and all questions were answered.  Did advise her that she will need to follow-up with her GYN doctor as scheduled  Charlestine Night, PA-C 01/03/16 2013  Gerhard Munch, MD 01/03/16 2151

## 2016-01-03 NOTE — Discharge Instructions (Signed)
Return here as needed.  Follow-up with your GYN doctor as scheduled °

## 2016-01-03 NOTE — ED Notes (Signed)
Pt is here with 3 days of vaginal bleeding that was light on the first two days and now heavy and dark red.  LMP was 10/22/2015.  Pt thinks she is about 2.5 months pregnant.  G-3 P-2.  Pt is having pain to LLQ

## 2016-01-04 ENCOUNTER — Encounter: Payer: Self-pay | Admitting: Emergency Medicine

## 2016-01-04 ENCOUNTER — Emergency Department
Admission: EM | Admit: 2016-01-04 | Discharge: 2016-01-04 | Disposition: A | Discharge status: Home / Self Care | Payer: BLUE CROSS/BLUE SHIELD | Attending: Emergency Medicine | Admitting: Emergency Medicine

## 2016-01-04 DIAGNOSIS — O200 Threatened abortion: Secondary | ICD-10-CM | POA: Diagnosis not present

## 2016-01-04 DIAGNOSIS — Z3A10 10 weeks gestation of pregnancy: Secondary | ICD-10-CM | POA: Insufficient documentation

## 2016-01-04 DIAGNOSIS — N939 Abnormal uterine and vaginal bleeding, unspecified: Secondary | ICD-10-CM | POA: Diagnosis present

## 2016-01-04 LAB — CBC WITH DIFFERENTIAL/PLATELET
BASOS PCT: 1 %
Basophils Absolute: 0 10*3/uL (ref 0–0.1)
Eosinophils Absolute: 0.2 10*3/uL (ref 0–0.7)
Eosinophils Relative: 4 %
HEMATOCRIT: 39.3 % (ref 35.0–47.0)
Hemoglobin: 13 g/dL (ref 12.0–16.0)
LYMPHS ABS: 1.5 10*3/uL (ref 1.0–3.6)
LYMPHS PCT: 28 %
MCH: 30.6 pg (ref 26.0–34.0)
MCHC: 33.1 g/dL (ref 32.0–36.0)
MCV: 92.4 fL (ref 80.0–100.0)
MONO ABS: 0.4 10*3/uL (ref 0.2–0.9)
MONOS PCT: 7 %
NEUTROS ABS: 3.3 10*3/uL (ref 1.4–6.5)
Neutrophils Relative %: 60 %
Platelets: 206 10*3/uL (ref 150–440)
RBC: 4.26 MIL/uL (ref 3.80–5.20)
RDW: 14.1 % (ref 11.5–14.5)
WBC: 5.5 10*3/uL (ref 3.6–11.0)

## 2016-01-04 LAB — HCG, QUANTITATIVE, PREGNANCY: hCG, Beta Chain, Quant, S: 2596 m[IU]/mL — ABNORMAL HIGH (ref <5)

## 2016-01-04 NOTE — ED Provider Notes (Signed)
[facility] Emergency Department Provider Note  ____________________________________________   I have reviewed the triage vital signs and the nursing notes.   HISTORY  Chief Complaint Abdominal Pain and Vaginal Bleeding    HPI Katelyn Bean is a 30 y.o. female last menstrual period at the beginning of March, making her approximately 10 weeks 4 days by dates, she is G3 P0, she was seen yesterday at [facility] with vaginal bleeding which is been there off and on since Thursday. She describes a minimally is mild to moderate although she has passed some clots. She has not passed tissue so far she knows. Her Quant yesterday was 56, today the seventh 2596. Patient is here because she continues to have bleeding and cramping. Patient has had a ultrasound yesterday which showed "Early gestational sac is noted. No definitive fetal pole is seen at this time. A second anechoic structure is noted which may represent a second gestational sac. Followup examination and correlation with beta HCG levels is recommended.Early gestational sac is noted. No definitive fetal pole is seen at this time. A second anechoic structure is noted which may represent a second gestational sac. Followup examination and correlation with beta HCG levels is recommended."  She is not having cramping at this time    Past Medical History  Diagnosis Date  . Medical history non-contributory   . Syncope     x2  . Fatigue   . Gave birth to child recently     x2    Patient Active Problem List   Diagnosis Date Noted  . Normal delivery at term 07/01/2014  . Active labor 06/30/2014    Past Surgical History  Procedure Laterality Date  . No past surgeries      Current Outpatient Rx  Name  Route  Sig  Dispense  Refill  . Artificial Tear Ointment (DRY EYES OP)   Both Eyes   Place 1 drop into both eyes daily as needed (for dry eyes).         . fluticasone (FLONASE) 50 MCG/ACT nasal  spray   Each Nare   Place 1 spray into both nostrils daily as needed for allergies or rhinitis.         . Prenatal Vit-Fe Fumarate-FA (PRENATAL MULTIVITAMIN) TABS tablet   Oral   Take 1 tablet by mouth at bedtime.           Allergies Review of patient's allergies indicates no known allergies.  Family History  Problem Relation Age of Onset  . Hypertension Mother   . Arthritis Father     Social History Social History  Substance Use Topics  . Smoking status: Never Smoker   . Smokeless tobacco: Never Used  . Alcohol Use: No    Review of Systems Constitutional: No fever/chills Eyes: No visual changes. ENT: No sore throat. No stiff neck no neck pain Cardiovascular: Denies chest pain. Respiratory: Denies shortness of breath. Gastrointestinal:   no vomiting.  No diarrhea.  No constipation. Genitourinary: Negative for dysuria. Musculoskeletal: Negative lower extremity swelling Skin: Negative for rash. Neurological: Negative for headaches, focal weakness or numbness. 10-point ROS otherwise negative.  ____________________________________________   PHYSICAL EXAM:  VITAL SIGNS: ED Triage Vitals  Enc Vitals Group     BP 01/04/16 1127 118/80 mmHg     Pulse Rate 01/04/16 1127 84     Resp 01/04/16 1127 20     Temp 01/04/16 1127 97.9 F (36.6 C)     Temp Source 01/04/16  1127 Oral     SpO2 01/04/16 1127 100 %     Weight 01/04/16 1127 103 lb (46.72 kg)     Height 01/04/16 1127 4\' 11"  (1.499 m)     Head Cir --      Peak Flow --      Pain Score 01/04/16 1128 6     Pain Loc --      Pain Edu? --      Excl. in GC? --     Constitutional: Alert and oriented. Well appearing and in no acute distress. Eyes: Conjunctivae are normal. PERRL. EOMI. Head: Atraumatic. Nose: No congestion/rhinnorhea. Mouth/Throat: Mucous membranes are moist.  Oropharynx non-erythematous. Neck: No stridor.   Nontender with no meningismus Cardiovascular: Normal rate, regular rhythm. Grossly normal  heart sounds.  Good peripheral circulation. Respiratory: Normal respiratory effort.  No retractions. Lungs CTAB. Abdominal: Soft and nontender. No distention. No guarding no rebound Back:  There is no focal tenderness or step off there is no midline tenderness there are no lesions noted. there is no CVA tenderness Pelvic: Musculoskeletal: No lower extremity tenderness. No joint effusions, no DVT signs strong distal pulses no edema Neurologic:  Normal speech and language. No gross focal neurologic deficits are appreciated.  Skin:  Skin is warm, dry and intact. No rash noted. Psychiatric: Mood and affect are normal. Speech and behavior are normal.  ____________________________________________   LABS (all labs ordered are listed, but only abnormal results are displayed)  Labs Reviewed  HCG, QUANTITATIVE, PREGNANCY - Abnormal; Notable for the following:    hCG, Beta Chain, Quant, S 2596 (*)    All other components within normal limits  CBC WITH DIFFERENTIAL/PLATELET   ____________________________________________  EKG  I personally interpreted any EKGs ordered by me or triage  ____________________________________________  RADIOLOGY  I reviewed any imaging ordered by me or triage that were performed during my shift and, if possible, patient and/or family made aware of any abnormal findings. ____________________________________________   PROCEDURES  Procedure(s) performed: None  Critical Care performed: None  ____________________________________________   INITIAL IMPRESSION / ASSESSMENT AND PLAN / ED COURSE  Pertinent labs & imaging results that were available during my care of the patient were reviewed by me and considered in my medical decision making (see chart for details).  Patient with a Sharene ButtersQuant that is trending down likely suggestive of a inevitable AB. She is Rh+ by prior blood check, her hemoglobin was 13 her vital signs are reassuring. We will check a pelvic exam and  discuss with OB.  ----------------------------------------- 4:37 PM on 01/04/2016 -----------------------------------------  Pelvic exam: Female nurse chaperone present, no external lesions noted, no vaginal discharge noted with no purulent discharge, no cervical motion tenderness, no adnexal tenderness or mass, there is no significant uterine tenderness or mass. There is mild to moderate amounts of dark blood in the vault. There is also at the office complex tissue structure which is approximately 3 x 2.5 cm which was taken out with a ring forceps. No definitive fetal structures noted.  Discussed with Dr. Feliberto GottronSchermerhorn, OB/GYN who agrees with management and will follow-up as an outpatient tomorrow morning at 9:00. Agrees with discharge. Extensive return precautions and follow-up given and understood. Patient understands this is likely an inevitable miscarriage given her chronic trending Quant. Discussed with Dr. Feliberto GottronSchermerhorn patient's exam findings vital signs CBC, Quant, and ultrasound. ____________________________________________   FINAL CLINICAL IMPRESSION(S) / ED DIAGNOSES  Final diagnoses:  None      This chart was dictated using voice  recognition software.  Despite best efforts to proofread,  errors can occur which can change meaning.     Jeanmarie Plant, MD 01/04/16 [phone]

## 2016-01-04 NOTE — Discharge Instructions (Signed)
It is likely you are having a miscarriage. We will not know for sure until you see the OB tomorrow. If you have increased pain, bleeding more than a pad an hour, or you feel worse in any way including light headed, return to the emergency department.  Follow up with the OB listed tomorrow morning at 9 am.

## 2016-01-04 NOTE — ED Notes (Signed)
Pt placed in room 33.  Pt presented to ED with heavy vaginal bleeding and cramping since Thursday.  Reports she is approx [redacted] weeks pregnant.  [facility].  Pt walked to bathroom and reported large amounts of clotting and vaginal bleeding and cramping.  Pt placed in gown.  Skin warm and dry and color good.  Pt alert and oriented, abd nontender, pt reports primarily pelvic pain. Beta HCG lower today than yesterday at [facility].

## 2016-01-04 NOTE — ED Notes (Signed)
Pt to ed with c/o vaginal bleeding since Thursday.  Pt states she is approx [redacted] weeks pregnant and was seen [facility].  Pt states she was told to come back if bleeding became worse.  Pt states this am she was passing large clots.  Pt also reports abd pain and cramping.

## 2017-02-27 IMAGING — US US OB COMP LESS 14 WK
1 series · 14 of 28 positions shown · non-contrast
Comparison: None.

CLINICAL DATA: Pelvic pain and vaginal bleeding, positive pregnancy
test

EXAM:
OBSTETRIC <14 WK US AND TRANSVAGINAL OB US
TECHNIQUE: Both transabdominal and transvaginal ultrasound examinations were
performed for complete evaluation of the gestation as well as the
maternal uterus, adnexal regions, and pelvic cul-de-sac.
Transvaginal technique was performed to assess early pregnancy.

[Series 1: us ob comp less 14 wk · 0.19mm/px · 46 acquisitions, 14 frames shown]
[im 2/46]
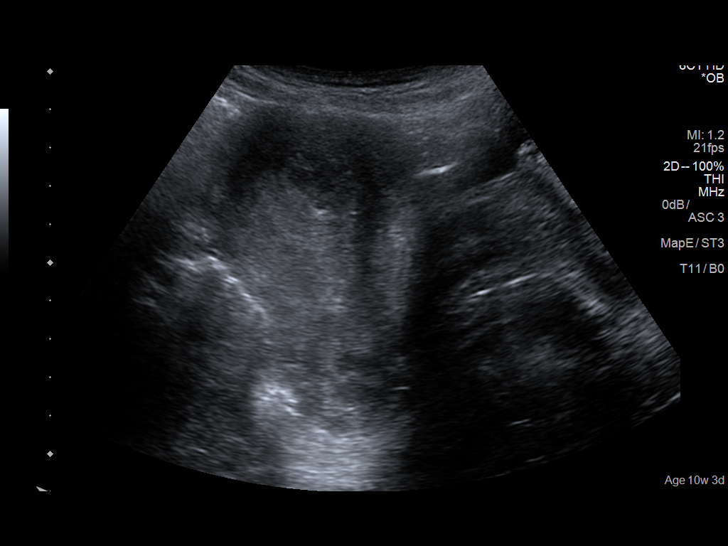
[im 6/46]
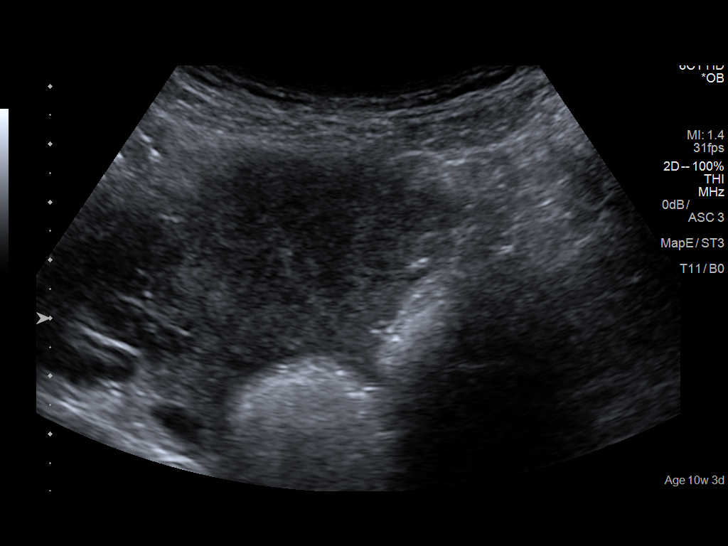
[im 9/46]
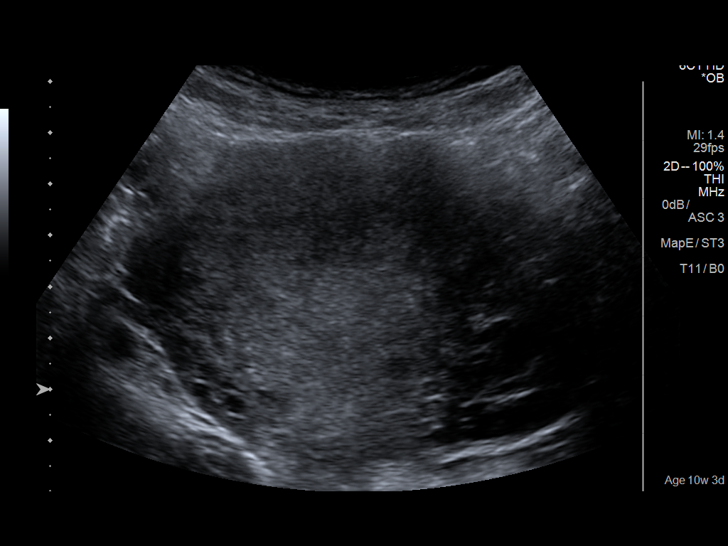
[im 12/46]
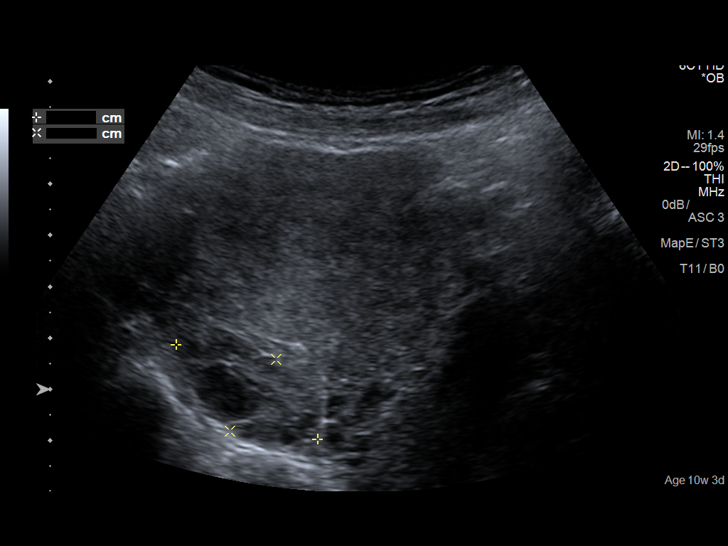
[im 16/46]
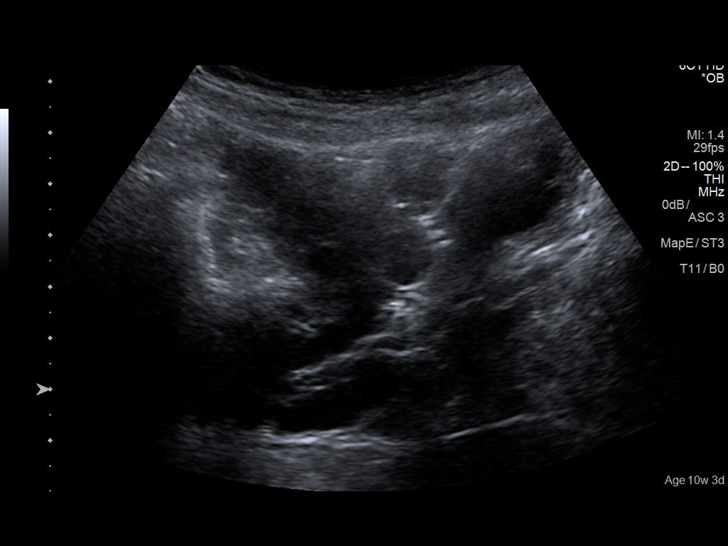
[im 19/46]
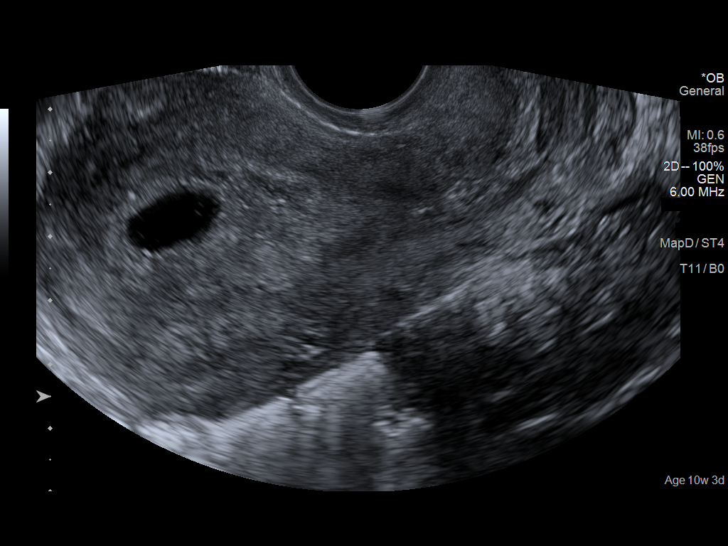
[im 22/46]
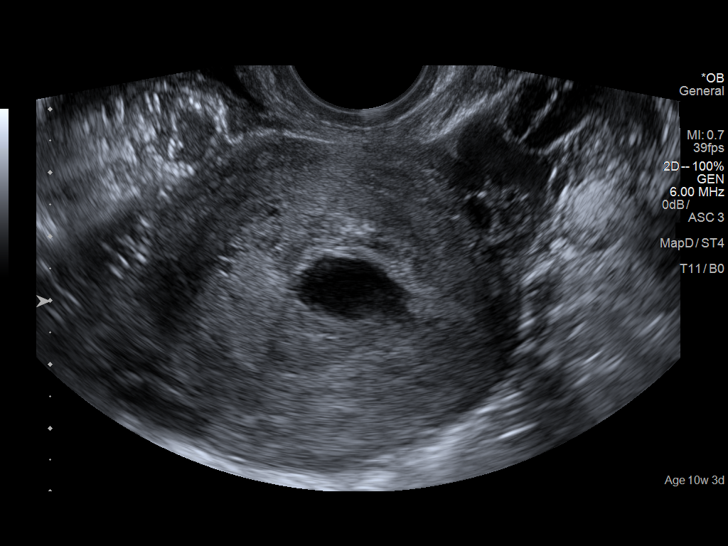
[im 26/46]
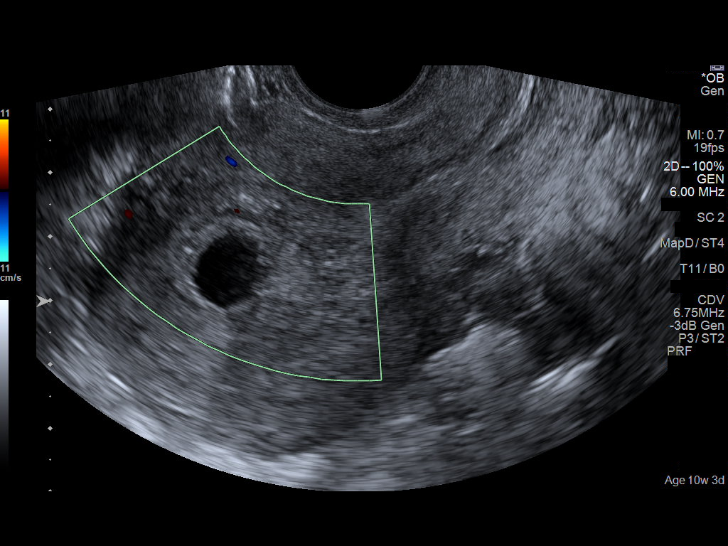
[im 29/46]
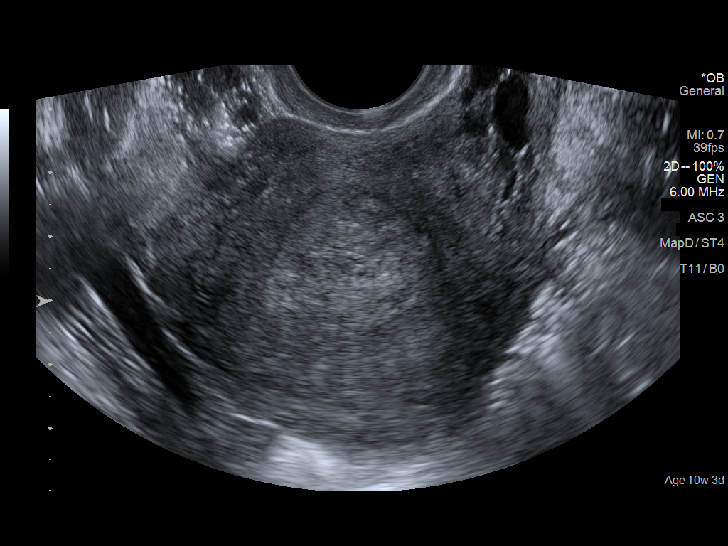
[im 32/46]
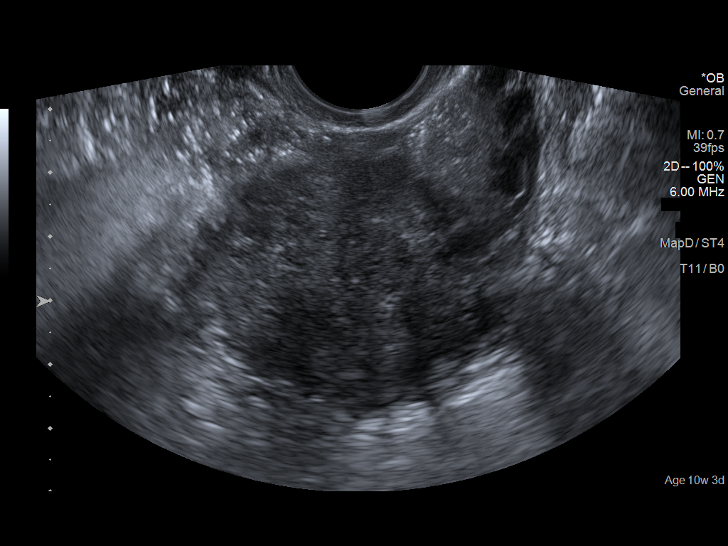
[im 36/46]
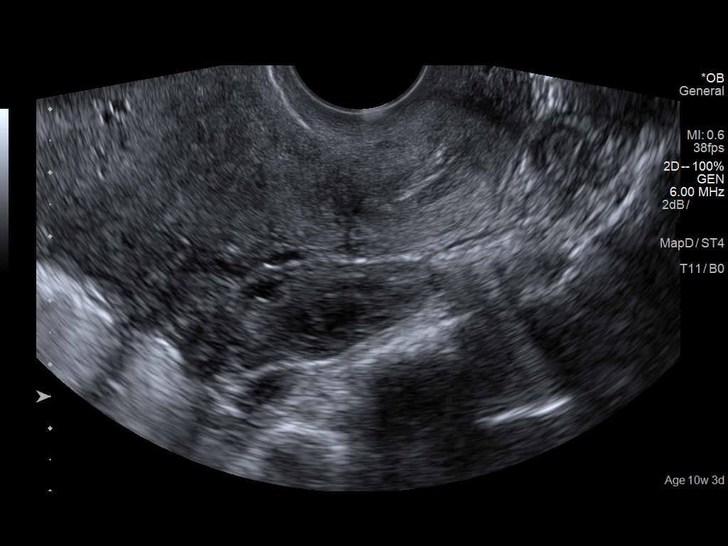
[im 39/46]
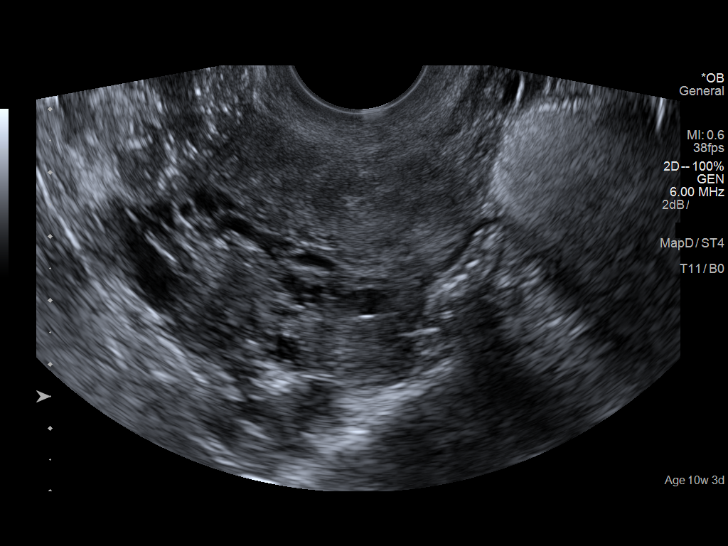
[im 42/46]
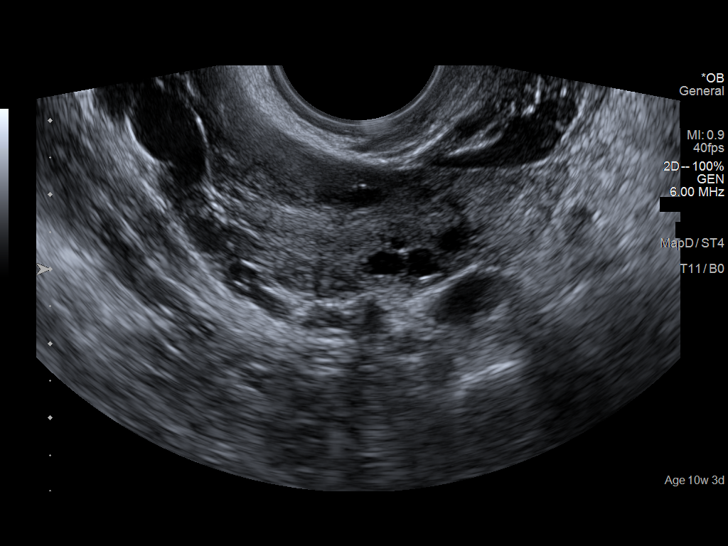
[im 46/46]
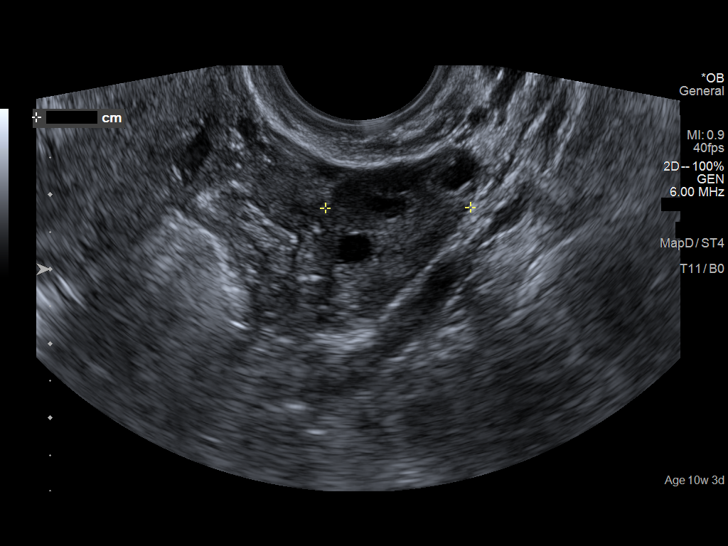

[14 of 28 positions shown; findings below may reference images not displayed]

FINDINGS: Intrauterine gestational sac: Present. Adjacent to the gestational
sac is a smaller anechoic structure which may represent a second
gestational sac.

Subchorionic hemorrhage:  None visualized.

Maternal uterus/adnexae: The ovaries are within normal limits. No
free pelvic fluid is seen.
IMPRESSION: Early gestational sac is noted. No definitive fetal pole is seen at
this time. A second anechoic structure is noted which may represent
a second gestational sac. Followup examination and correlation with
beta HCG levels is recommended.

## 2017-07-18 ENCOUNTER — Other Ambulatory Visit: Payer: Self-pay | Admitting: Family Medicine

## 2017-07-18 ENCOUNTER — Other Ambulatory Visit (HOSPITAL_COMMUNITY)
Admission: RE | Admit: 2017-07-18 | Discharge: 2017-07-18 | Disposition: A | Discharge status: Home / Self Care | Payer: BLUE CROSS/BLUE SHIELD | Source: Ambulatory Visit | Attending: Family Medicine | Admitting: Family Medicine

## 2017-07-18 DIAGNOSIS — Z01411 Encounter for gynecological examination (general) (routine) with abnormal findings: Secondary | ICD-10-CM | POA: Diagnosis present

## 2017-07-21 LAB — CYTOLOGY - PAP
Adequacy: ABSENT
Diagnosis: NEGATIVE

## 2019-01-02 ENCOUNTER — Other Ambulatory Visit: Payer: Self-pay

## 2019-01-02 ENCOUNTER — Ambulatory Visit: Payer: Self-pay | Admitting: Physician Assistant

## 2019-01-02 ENCOUNTER — Encounter: Payer: Self-pay | Admitting: Physician Assistant

## 2019-01-02 VITALS — BP 117/82 | HR 73 | Temp 97.8°F | Resp 15 | Ht 59.0 in | Wt 105.2 lb

## 2019-01-02 DIAGNOSIS — Z3009 Encounter for other general counseling and advice on contraception: Secondary | ICD-10-CM

## 2019-01-02 DIAGNOSIS — Z124 Encounter for screening for malignant neoplasm of cervix: Secondary | ICD-10-CM

## 2019-01-02 NOTE — Patient Instructions (Signed)
[facility]:  Address: [address], 1st floor, [address], [place], [place] [address] Phone: [phone]   Contraception Choices Contraception, also called birth control, means things to use or ways to try not to get pregnant. Hormonal birth control This kind of birth control uses hormones. Here are some types of hormonal birth control:  A tube that is put under skin of the arm (implant). The tube can stay in for as long as 3 years.  Shots to get every 3 months (injections).  Pills to take every day (birth control pills).  A patch to change 1 time each week for 3 weeks (birth control patch). After that, the patch is taken off for 1 week.  A ring to put in the vagina. The ring is left in for 3 weeks. Then it is taken out of the vagina for 1 week. Then a new ring is put in.  Pills to take after unprotected sex (emergency birth control pills). Barrier birth control Here are some types of barrier birth control:  A thin covering that is put on the penis before sex (female condom). The covering is thrown away after sex.  A soft, loose covering that is put in the vagina before sex (female condom). The covering is thrown away after sex.  A rubber bowl that sits over the cervix (diaphragm). The bowl must be made for you. The bowl is put into the vagina before sex. The bowl is left in for 6-8 hours after sex. It is taken out within 24 hours.  A small, soft cup that fits over the cervix (cervical cap). The cup must be made for you. The cup can be left in for 6-8 hours after sex. It is taken out within 48 hours.  A sponge that is put into the vagina before sex. It must be left in for at least 6 hours after sex. It must be taken out within 30 hours. Then it is thrown away.  A chemical that kills or stops sperm from getting into the uterus (spermicide). It may be a pill, cream, jelly, or foam to put in the vagina. The chemical should be used at least 10-15  minutes before sex. IUD (intrauterine) birth control An IUD is a small, T-shaped piece of plastic. It is put inside the uterus. There are two kinds:  Hormone IUD. This kind can stay in for 3-5 years.  Copper IUD. This kind can stay in for 10 years. Permanent birth control Here are some types of permanent birth control:  Surgery to block the fallopian tubes.  Having an insert put into each fallopian tube.  Surgery to tie off the tubes that carry sperm (vasectomy). Natural planning birth control Here are some types of natural planning birth control:  Not having sex on the days the woman could get pregnant.  Using a calendar: ? To keep track of the length of each period. ? To find out what days pregnancy can happen. ? To plan to not have sex on days when pregnancy can happen.  Watching for symptoms of ovulation and not having sex during ovulation. One way the woman can check for ovulation is to check her temperature.  Waiting to have sex until after ovulation. Summary  Contraception, also called birth control, means things to use or ways to try not to get pregnant.  Hormonal methods of birth control include implants, injections, pills, patches, vaginal rings, and emergency birth control pills.  Barrier methods of birth control can  include female condoms, female condoms, diaphragms, cervical caps, sponges, and spermicides.  There are two types of IUD (intrauterine device) birth control. An IUD can be put in a woman's uterus to prevent pregnancy for 3-5 years.  Permanent sterilization can be done through a procedure for males, females, or both.  Natural planning methods involve not having sex on the days when the woman could get pregnant. This information is not intended to replace advice given to you by your health care provider. Make sure you discuss any questions you have with your health care provider. Document Released: 06/05/2009 Document Revised: 03/15/2018 Document  Reviewed: 08/18/2016 Elsevier Interactive Patient Education  2019 [employer].

## 2019-01-02 NOTE — Progress Notes (Signed)
Patient: Katelyn Bean, Female    DOB: Oct 28, 1985, 33 y.o.   MRN: [id number] Visit Date: 01/02/2019  Today's Provider: Trey Sailors, PA-C   Chief Complaint  Patient presents with  . New Patient (Initial Visit)   Subjective:    New Patient Katelyn Bean is a 33 y.o. female who presents today for health maintenance and establish patient care. She feels well. She reports exercising 1-2x a week. She reports she is sleeping well.  Was seeing Dr. Hyman Hopes in [place]. She is living in [place] and works at a [employer] in [place].  Living with husband and children, mom and sister, two boys ages 6 and 4.5 She does not have any medical conditions.    PAP smear 06/2017 was negative, HPV not performed. She is having menstrual cycles. She is not using contraceptives right now, has tried multiple including IUD, nexplanon and pill which she did not tolerate. She is anticipating her husband will undergo vasectomy.  -----------------------------------------------------------------   Review of Systems  All other systems reviewed and are negative.   Social History She  reports that she has never smoked. She has never used smokeless tobacco. She reports that she does not drink alcohol or use drugs. Social History   Socioeconomic History  . Marital status: Married    Spouse name: Not on file  . Number of children: Not on file  . Years of education: Not on file  . Highest education level: Not on file  Occupational History  . Not on file  Social Needs  . Financial resource strain: Not on file  . Food insecurity:    Worry: Not on file    Inability: Not on file  . Transportation needs:    Medical: Not on file    Non-medical: Not on file  Tobacco Use  . Smoking status: Never Smoker  . Smokeless tobacco: Never Used  Substance and Sexual Activity  . Alcohol use: No  . Drug use: No  . Sexual activity: Yes  Lifestyle  . Physical activity:    Days per week: Not on file      Minutes per session: Not on file  . Stress: Not on file  Relationships  . Social connections:    Talks on phone: Not on file    Gets together: Not on file    Attends religious service: Not on file    Active member of club or organization: Not on file    Attends meetings of clubs or organizations: Not on file    Relationship status: Not on file  Other Topics Concern  . Not on file  Social History Narrative  . Not on file    Patient Active Problem List   Diagnosis Date Noted  . Normal delivery at term 07/01/2014  . Active labor 06/30/2014    Past Surgical History:  Procedure Laterality Date  . BREAST SURGERY      Family History  Family Status  Relation Name Status  . Mother  Alive  . Father  Alive  . MGM  Alive  . MGF  Deceased  . PGM  Deceased  . PGF  Deceased   Her family history includes Arthritis in her father; Hypertension in her mother.     No Known Allergies  Previous Medications   No medications on file    Patient Care Team: Maryella Shivers as PCP - General (Physician Assistant)      Objective:  Vitals: BP 117/82   Pulse 73   Temp 97.8 F (36.6 C) (Oral)   Resp 15   Ht 4\' 11"  (1.499 m)   Wt 105 lb 3.2 oz (47.7 kg)   LMP 12/23/2018 (Exact Date)   Breastfeeding No   BMI 21.25 kg/m    Physical Exam Constitutional:      Appearance: Normal appearance.  HENT:     Right Ear: Tympanic membrane normal.     Left Ear: Tympanic membrane normal.     Mouth/Throat:     Mouth: Mucous membranes are moist.  Cardiovascular:     Rate and Rhythm: Normal rate and regular rhythm.     Heart sounds: Normal heart sounds.  Pulmonary:     Effort: Pulmonary effort is normal.     Breath sounds: Normal breath sounds.  Skin:    General: Skin is warm and dry.  Neurological:     Mental Status: She is alert and oriented to person, place, and time. Mental status is at baseline.  Psychiatric:        Mood and Affect: Mood normal.        Behavior:  Behavior normal.      Depression Screen PHQ 2/9 Scores 01/02/2019  PHQ - 2 Score 0  PHQ- 9 Score 0      Assessment & Plan:     Routine Health Maintenance and Physical Exam  Exercise Activities and Dietary recommendations Goals   None     Immunization History  Administered Date(s) Administered  . Influenza,inj,Quad PF,6+ Mos 07/02/2014  . Tdap 05/01/2014    Health Maintenance  Topic Date Due  . INFLUENZA VACCINE  03/23/2019  . PAP SMEAR-Modifier  07/18/2020  . TETANUS/TDAP  05/01/2024  . HIV Screening  Completed     Discussed health benefits of physical activity, and encouraged her to engage in regular exercise appropriate for her age and condition.    1. Family planning  Provided information about vasectomy services through urology.   2. Cervical cancer screening  Advised she is due for PAP in 06/2020 and we can Mast that for her.  The entirety of the information documented in the History of Present Illness, Review of Systems and Physical Exam were personally obtained by me. Portions of this information were initially documented by Sheliah HatchKathleen Wolford, CMA and reviewed by me for thoroughness and accuracy.       -------------------------------------------------------------------- Leo RodI,Kathleen J Wolford,acting as a scribe for Trey SailorsAdriana M Lenox Ladouceur, PA-C.,have documented all relevant documentation on the behalf of Trey Sailorsdriana M Danaly Bari, PA-C,as directed by  Trey SailorsAdriana M Bj Morlock, PA-C while in the presence of Trey SailorsAdriana M Maritza Hosterman, PA-C.

## 2024-05-10 ENCOUNTER — Encounter: Payer: Self-pay | Admitting: Nurse Practitioner

## 2024-05-10 ENCOUNTER — Telehealth: Payer: Self-pay

## 2024-05-10 ENCOUNTER — Ambulatory Visit: Admitting: Nurse Practitioner

## 2024-05-10 VITALS — BP 116/76 | HR 75 | Temp 98.4°F | Ht 59.0 in | Wt 113.2 lb

## 2024-05-10 DIAGNOSIS — B001 Herpesviral vesicular dermatitis: Secondary | ICD-10-CM | POA: Diagnosis not present

## 2024-05-10 DIAGNOSIS — Z13220 Encounter for screening for lipoid disorders: Secondary | ICD-10-CM | POA: Diagnosis not present

## 2024-05-10 DIAGNOSIS — Z131 Encounter for screening for diabetes mellitus: Secondary | ICD-10-CM

## 2024-05-10 DIAGNOSIS — J309 Allergic rhinitis, unspecified: Secondary | ICD-10-CM | POA: Diagnosis not present

## 2024-05-10 DIAGNOSIS — Z889 Allergy status to unspecified drugs, medicaments and biological substances status: Secondary | ICD-10-CM | POA: Insufficient documentation

## 2024-05-10 DIAGNOSIS — Z23 Encounter for immunization: Secondary | ICD-10-CM | POA: Diagnosis not present

## 2024-05-10 DIAGNOSIS — Z1159 Encounter for screening for other viral diseases: Secondary | ICD-10-CM

## 2024-05-10 DIAGNOSIS — K5900 Constipation, unspecified: Secondary | ICD-10-CM

## 2024-05-10 LAB — LIPID PANEL
Cholesterol: 202 mg/dL — ABNORMAL HIGH (ref 0–200)
HDL: 92.6 mg/dL (ref >39.00)
LDL Cholesterol: 100 mg/dL — ABNORMAL HIGH (ref 0–99)
NonHDL: 109.72
Total CHOL/HDL Ratio: 2
Triglycerides: 49 mg/dL (ref 0.0–149.0)
VLDL: 9.8 mg/dL (ref 0.0–40.0)

## 2024-05-10 LAB — CBC WITH DIFFERENTIAL/PLATELET
Basophils Absolute: 0 K/uL (ref 0.0–0.1)
Basophils Relative: 0.7 % (ref 0.0–3.0)
Eosinophils Absolute: 0 K/uL (ref 0.0–0.7)
Eosinophils Relative: 0 % (ref 0.0–5.0)
HCT: 41.7 % (ref 36.0–46.0)
Hemoglobin: 13.8 g/dL (ref 12.0–15.0)
Lymphocytes Relative: 48.7 % — ABNORMAL HIGH (ref 12.0–46.0)
Lymphs Abs: 2 K/uL (ref 0.7–4.0)
MCHC: 33 g/dL (ref 30.0–36.0)
MCV: 90.8 fl (ref 78.0–100.0)
Monocytes Absolute: 0.3 K/uL (ref 0.1–1.0)
Monocytes Relative: 7.5 % (ref 3.0–12.0)
Neutro Abs: 1.8 K/uL (ref 1.4–7.7)
Neutrophils Relative %: 43.1 % (ref 43.0–77.0)
Platelets: 214 K/uL (ref 150.0–400.0)
RBC: 4.6 Mil/uL (ref 3.87–5.11)
RDW: 13.8 % (ref 11.5–15.5)
WBC: 4.1 K/uL (ref 4.0–10.5)

## 2024-05-10 LAB — COMPREHENSIVE METABOLIC PANEL WITH GFR
ALT: 17 U/L (ref 0–35)
AST: 17 U/L (ref 0–37)
Albumin: 4.6 g/dL (ref 3.5–5.2)
Alkaline Phosphatase: 34 U/L — ABNORMAL LOW (ref 39–117)
BUN: 11 mg/dL (ref 6–23)
CO2: 26 meq/L (ref 19–32)
Calcium: 9.3 mg/dL (ref 8.4–10.5)
Chloride: 105 meq/L (ref 96–112)
Creatinine, Ser: 0.56 mg/dL (ref 0.40–1.20)
GFR: 116.52 mL/min (ref >60.00)
Glucose, Bld: 78 mg/dL (ref 70–99)
Potassium: 4.4 meq/L (ref 3.5–5.1)
Sodium: 138 meq/L (ref 135–145)
Total Bilirubin: 0.4 mg/dL (ref 0.2–1.2)
Total Protein: 7.4 g/dL (ref 6.0–8.3)

## 2024-05-10 LAB — HEMOGLOBIN A1C: Hgb A1c MFr Bld: 5.8 % (ref 4.6–6.5)

## 2024-05-10 LAB — TSH: TSH: 2.34 u[IU]/mL (ref 0.35–5.50)

## 2024-05-10 NOTE — Assessment & Plan Note (Signed)
 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 Discussed potential HSV etiology. - Order blood work to check for HSV antibodies.

## 2024-05-10 NOTE — Assessment & Plan Note (Signed)
 Symptoms of itching eyes and runny nose in spring due to pollen.  -Stable at present.

## 2024-05-10 NOTE — Telephone Encounter (Signed)
 Faxed a request to [facility] for this Patient's records. Received an ok confirmation.

## 2024-05-10 NOTE — Patient Instructions (Signed)
 Today, you visited to establish care at our facility and discussed several health concerns including seasonal allergies, constipation, weight management, and recurrent facial blisters. We also addressed general health maintenance and planned for necessary follow-ups.  YOUR PLAN:  ESTABLISH CARE: You are establishing care at our facility after relocating. You have no significant past medical history but have a family history of hypertension and stroke. -We have established your care at this facility.  CONSTIPATION: You are experiencing constipation, likely due to dietary changes for weight loss. -Increase your intake of water, fruits, and vegetables. -Consider using Miralax for relief if needed.   SEASONAL ALLERGIC RHINITIS: You have symptoms of itching eyes and runny nose in the spring due to pollen and receive an annual allergy shot. -Continue with your annual allergy shot as needed during the spring.  RECURRENT HERPES LABIALIS: You have recurrent episodes of facial blisters, which may be due to herpes simplex virus (HSV). -We will order blood work to check for HSV antibodies.  GENERAL HEALTH MAINTENANCE: You are due for a tetanus vaccine and need to obtain your Pap smear records. -We will administer the tetanus vaccine today. -We will obtain your Pap smear records from [facility] .

## 2024-05-10 NOTE — Assessment & Plan Note (Deleted)
 Symptoms of itching eyes and runny nose in spring due to pollen.  -Stable at present.

## 2024-05-10 NOTE — Progress Notes (Signed)
 New Patient Office Visit  Subjective   Patient ID: Katelyn Bean, female    DOB: Jul 24, 1986  Age: 38 y.o. MRN: [id number]  CC:  Chief Complaint  Patient presents with   Establish Care    HPI Katelyn Bean presents to establish care. Her previous PCP was Ms. Howley at [facility].   She is followed by [facility]gyn at [facility]. She has PAP smear this year. We will get the record.   She experiences seasonal allergies primarily in the spring, with symptoms of ocular pruritus and rhinorrhea.  She has recurrent facial blisters annually for the past three years, with lesions appearing on her face, and uses a topical cream for management.  She is on weight loss injection but does not recall the name, receiving it from [facility] [place] .  She has intermittent constipation. She is currently not taking any thing for it. Trying to increase fiber intake.   She is due for Tdap and flu. Declined flu vaccine.   Health Maintenance  Topic Date Due   Hepatitis C Screening  Never done   HPV VACCINES (1 - 3-dose SCDM series) Never done   Cervical Cancer Screening (HPV/Pap Cotest)  07/18/2020   COVID-19 Vaccine (1 - 2024-25 season) 05/24/2024 (Originally 04/22/2024)   Influenza Vaccine  11/19/2024 (Originally 03/22/2024)   DTaP/Tdap/Td (3 - Td or Tdap) 05/10/2034   Hepatitis B Vaccines 19-59 Average Risk  Completed   HIV Screening  Completed   Pneumococcal Vaccine  Aged Out   Meningococcal B Vaccine  Aged Out       Topic Date Due   HPV VACCINES (1 - 3-dose SCDM series) Never done    No outpatient encounter medications on file as of 05/10/2024.   No facility-administered encounter medications on file as of 05/10/2024.    Past Medical History:  Diagnosis Date   Fatigue    Gave birth to child recently    x2   Medical history non-contributory    Syncope    x2    Past Surgical History:  Procedure Laterality Date   BREAST SURGERY      Family History  Problem Relation Age of  Onset   Hypertension Mother    Stroke Father    Arthritis Father     Social History   Socioeconomic History   Marital status: Married    Spouse name: Not on file   Number of children: 2   Years of education: Not on file   Highest education level: Not on file  Occupational History   Not on file  Tobacco Use   Smoking status: Never   Smokeless tobacco: Never  Vaping Use   Vaping status: Never Used  Substance and Sexual Activity   Alcohol use: Yes    Comment: occasionally   Drug use: No   Sexual activity: Yes    Birth control/protection: Condom  Other Topics Concern   Not on file  Social History Narrative   Married, lives with husband and 2 sons.    Works at [employer]   Social Drivers of Corporate investment banker Strain: Not on file  Food Insecurity: No Food Insecurity (12/06/2021)   Received from [facility]   Hunger Vital Sign    Within the past 12 months, you worried that your food would run out before you got the money to buy more.: Never true    Within the past 12 months, the food you bought just didn't last and you  didn't have money to get more.: Never true  Transportation Needs: Not on file  Physical Activity: Not on file  Stress: Not on file  Social Connections: Not on file  Intimate Partner Violence: Not on file    ROS Negative unless indicated in HPI.      Objective    BP 116/76   Pulse 75   Temp 98.4 F (36.9 C)   Ht 4' 11 (1.499 m)   Wt 113 lb 3.2 oz (51.3 kg)   LMP 04/24/2024 (Approximate)   SpO2 99%   BMI 22.86 kg/m   Physical Exam Constitutional:      Appearance: Normal appearance.  HENT:     Right Ear: Tympanic membrane normal.     Left Ear: Tympanic membrane normal.     Mouth/Throat:     Mouth: Mucous membranes are moist.  Eyes:     Conjunctiva/sclera: Conjunctivae normal.     Pupils: Pupils are equal, round, and reactive to light.  Cardiovascular:     Rate and Rhythm: Normal rate and regular rhythm.     Pulses:  Normal pulses.     Heart sounds: Normal heart sounds.  Pulmonary:     Effort: Pulmonary effort is normal.     Breath sounds: Normal breath sounds.  Abdominal:     General: Bowel sounds are normal.     Palpations: Abdomen is soft.  Musculoskeletal:     Cervical back: Normal range of motion. No tenderness.  Skin:    General: Skin is warm.     Findings: No bruising.  Neurological:     General: No focal deficit present.     Mental Status: She is alert and oriented to person, place, and time. Mental status is at baseline.  Psychiatric:        Mood and Affect: Mood normal.        Behavior: Behavior normal.        Thought Content: Thought content normal.        Judgment: Judgment normal.         Assessment & Plan:  Chronic allergic rhinitis Assessment & Plan: Symptoms of itching eyes and runny nose in spring due to pollen.  -Stable at present.   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 Discussed potential HSV etiology. - Order blood work to check for HSV antibodies.  Orders: -     HSV(herpes simplex vrs) 1+2 ab-IgG  Screening for diabetes mellitus -     TSH -     Hemoglobin A1c  Screening for lipid disorders -     Lipid panel  Constipation, unspecified constipation type Assessment & Plan: Constipation likely due to dietary changes for weight loss. - Increase intake of water, fruits, and vegetables. - Consider using Miralax for relief.   Need for hepatitis C screening test -     Hepatitis C antibody  Other orders -     Tdap vaccine greater than or equal to 7yo IM    Return in about 4 months (around 09/09/2024) for physical.   Catherin Doorn, NP

## 2024-05-10 NOTE — Assessment & Plan Note (Signed)
 Constipation likely due to dietary changes for weight loss. - Increase intake of water, fruits, and vegetables. - Consider using Miralax for relief.

## 2024-05-10 NOTE — Addendum Note (Signed)
 Addended by: VINCENTE SABER on: 05/10/2024 09:59 AM   Modules accepted: Level of Service

## 2024-05-11 LAB — HSV(HERPES SIMPLEX VRS) I + II AB-IGG
HSV 1 IGG,TYPE SPECIFIC AB: 45.4 {index} — ABNORMAL HIGH
HSV 2 IGG,TYPE SPECIFIC AB: <0.9 {index}

## 2024-05-14 ENCOUNTER — Ambulatory Visit: Payer: Self-pay | Admitting: Nurse Practitioner

## 2024-05-14 NOTE — Progress Notes (Signed)
 The blood work shows no sign of anemia, electrolytes normal. Liver, kidney and thyroid normal.  Cholesterol elevated: Manage diet and exercise.  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  If she has no active cold sore at present she does not need any treatment.

## 2024-10-03 ENCOUNTER — Encounter: Admitting: Nurse Practitioner
